# Patient Record
Sex: Female | Born: 1985 | Race: Black or African American | Hispanic: No | Marital: Single | Smoking: Never smoker
Health system: Southern US, Community
[De-identification: ages and names within clinical notes are randomized; demographics above are authoritative.]

## PROBLEM LIST (undated history)

## (undated) DIAGNOSIS — N6310 Unspecified lump in the right breast, unspecified quadrant: Secondary | ICD-10-CM

## (undated) HISTORY — PX: ACHILLES TENDON SURGERY: SHX542

## (undated) HISTORY — DX: Unspecified lump in the right breast, unspecified quadrant: N63.10

---
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
## 2004-08-25 HISTORY — PX: TUBAL LIGATION: SHX77

## 2014-05-11 ENCOUNTER — Emergency Department (HOSPITAL_COMMUNITY)
Admission: EM | Admit: 2014-05-11 | Discharge: 2014-05-11 | Disposition: A | Discharge status: Home / Self Care | Payer: Medicaid Other | Attending: Emergency Medicine | Admitting: Emergency Medicine

## 2014-05-11 ENCOUNTER — Encounter (HOSPITAL_COMMUNITY): Payer: Self-pay | Admitting: Emergency Medicine

## 2014-05-11 DIAGNOSIS — K047 Periapical abscess without sinus: Secondary | ICD-10-CM | POA: Insufficient documentation

## 2014-05-11 DIAGNOSIS — K029 Dental caries, unspecified: Secondary | ICD-10-CM | POA: Diagnosis not present

## 2014-05-11 DIAGNOSIS — K089 Disorder of teeth and supporting structures, unspecified: Secondary | ICD-10-CM | POA: Diagnosis present

## 2014-05-11 DIAGNOSIS — K0520 Aggressive periodontitis, unspecified: Secondary | ICD-10-CM

## 2014-05-11 MED ORDER — HYDROCODONE-ACETAMINOPHEN 5-325 MG PO TABS: 1.0000 | ORAL_TABLET | ORAL | Status: DC | PRN

## 2014-05-11 MED ORDER — AMOXICILLIN 500 MG PO CAPS: 500.0000 mg | ORAL_CAPSULE | Freq: Three times a day (TID) | ORAL | Status: DC

## 2014-05-11 NOTE — ED Notes (Signed)
Rt mandibular dental pain for 2 days

## 2014-05-11 NOTE — ED Provider Notes (Signed)
CSN: [id number]     Arrival date & time 05/11/14  1742 History   First MD Initiated Contact with Patient 05/11/14 1751     Chief Complaint  Patient presents with  . Dental Pain     (Consider location/radiation/quality/duration/timing/severity/associated sxs/prior Treatment) Patient is a 28 y.o. female presenting with tooth pain. The history is provided by the patient.  Dental Pain Location:  Lower Lower teeth location:  32/RL 3rd molar Quality:  Throbbing and constant Severity:  Severe Onset quality:  Gradual Duration:  3 days Timing:  Constant Progression:  Worsening Chronicity:  New Context: abscess and dental caries   Relieved by:  Nothing Worsened by:  Cold food/drink, pressure and jaw movement Ineffective treatments:  Acetaminophen Associated symptoms: facial swelling and gum swelling   Associated symptoms: no trismus    Joan Mills is a 28 y.o. female who presents to the ED with right lower wisdom tooth pain x 3 days. She has noted swelling of the gum and the side of her right jaw. She denies fever or other problems.  History reviewed. No pertinent past medical history. Past Surgical History  Procedure Laterality Date  . Achilles tendon surgery     History reviewed. No pertinent family history. History  Substance Use Topics  . Smoking status: Never Smoker   . Smokeless tobacco: Not on file  . Alcohol Use: Yes   OB History   Grav Para Term Preterm Abortions TAB SAB Ect Mult Living                 Review of Systems  HENT: Positive for dental problem and facial swelling.   all other systems negative    Allergies  Review of patient's allergies indicates no known allergies.  Home Medications   Prior to Admission medications   Medication Sig Start Date End Date Taking? Authorizing Provider  acetaminophen (TYLENOL) 500 MG tablet Take 500 mg by mouth every 6 (six) hours as needed for mild pain.   Yes Historical Provider, MD  Aspirin-Caffeine (BC FAST PAIN  RELIEF PO) Take 1 Package by mouth daily as needed (pain).   Yes Historical Provider, MD   BP 123/64  Pulse 65  Temp(Src) 99 F (37.2 C) (Oral)  Resp 16  SpO2 100%  LMP 04/13/2014 Physical Exam  Nursing note and vitals reviewed. Constitutional: She is oriented to person, place, and time. She appears well-developed and well-nourished. No distress.  HENT:  Mouth/Throat: Uvula is midline, oropharynx is clear and moist and mucous membranes are normal.    Right lower third molar with decay on the partially erupted tooth. There is swelling and erythema of the gum surrounding the tooth. The tooth is tender on exam.   Eyes: EOM are normal.  Neck: Neck supple.  Cardiovascular: Normal rate.   Pulmonary/Chest: Effort normal.  Musculoskeletal: Normal range of motion.  Neurological: She is alert and oriented to person, place, and time. No cranial nerve deficit.  Skin: Skin is warm and dry.  Psychiatric: She has a normal mood and affect. Her behavior is normal.    ED Course  Procedures (including critical care time) Labs Review  MDM  28 y.o. female with dental abscess and dental decay. Will treat for infection and pain. Information regarding dental clinics given. Discussed with the patient and all questioned fully answered.    Medication List    TAKE these medications       amoxicillin 500 MG capsule  Commonly known as:  AMOXIL  Take 1 capsule (  500 mg total) by mouth 3 (three) times daily.     HYDROcodone-acetaminophen 5-325 MG per tablet  Commonly known as:  NORCO/VICODIN  Take 1 tablet by mouth every 4 (four) hours as needed.      ASK your doctor about these medications       acetaminophen 500 MG tablet  Commonly known as:  TYLENOL  Take 500 mg by mouth every 6 (six) hours as needed for mild pain.     BC FAST PAIN RELIEF PO  Take 1 Package by mouth daily as needed (pain).            [facility] Orlene Och, [place] 05/11/14 [phone]

## 2014-05-11 NOTE — Discharge Instructions (Signed)
You can take ibuprofen in addition to the medications we give you. Do not take the narcotic if driving as it will make you sleepy.

## 2014-05-16 NOTE — ED Provider Notes (Signed)
Medical screening examination/treatment/procedure(s) were performed by non-physician practitioner and as supervising physician I was immediately available for consultation/collaboration.   EKG Interpretation None        Rin Gorton, MD 05/16/14 1538 

## 2014-10-10 ENCOUNTER — Other Ambulatory Visit (HOSPITAL_COMMUNITY): Payer: Self-pay | Admitting: *Deleted

## 2014-10-10 DIAGNOSIS — N63 Unspecified lump in unspecified breast: Secondary | ICD-10-CM

## 2014-10-17 ENCOUNTER — Ambulatory Visit (HOSPITAL_COMMUNITY): Payer: Medicaid Other

## 2014-10-17 ENCOUNTER — Other Ambulatory Visit (HOSPITAL_COMMUNITY): Payer: Self-pay | Admitting: *Deleted

## 2014-10-17 ENCOUNTER — Ambulatory Visit (HOSPITAL_COMMUNITY)
Admission: RE | Admit: 2014-10-17 | Discharge: 2014-10-17 | Disposition: A | Discharge status: Home / Self Care | Payer: Medicaid Other | Source: Ambulatory Visit | Attending: *Deleted | Admitting: *Deleted

## 2014-10-17 ENCOUNTER — Other Ambulatory Visit (HOSPITAL_COMMUNITY): Payer: Medicaid Other

## 2014-10-17 DIAGNOSIS — N63 Unspecified lump in unspecified breast: Secondary | ICD-10-CM

## 2014-10-17 DIAGNOSIS — N6310 Unspecified lump in the right breast, unspecified quadrant: Secondary | ICD-10-CM

## 2014-10-17 HISTORY — DX: Unspecified lump in the right breast, unspecified quadrant: N63.10

## 2014-12-27 ENCOUNTER — Encounter (HOSPITAL_COMMUNITY): Payer: Self-pay | Admitting: Emergency Medicine

## 2014-12-27 ENCOUNTER — Emergency Department (HOSPITAL_COMMUNITY)
Admission: EM | Admit: 2014-12-27 | Discharge: 2014-12-27 | Disposition: A | Discharge status: Home / Self Care | Payer: Medicaid Other | Attending: Emergency Medicine | Admitting: Emergency Medicine

## 2014-12-27 DIAGNOSIS — Z792 Long term (current) use of antibiotics: Secondary | ICD-10-CM | POA: Diagnosis not present

## 2014-12-27 DIAGNOSIS — Z79899 Other long term (current) drug therapy: Secondary | ICD-10-CM | POA: Insufficient documentation

## 2014-12-27 DIAGNOSIS — J020 Streptococcal pharyngitis: Secondary | ICD-10-CM | POA: Diagnosis not present

## 2014-12-27 DIAGNOSIS — J029 Acute pharyngitis, unspecified: Secondary | ICD-10-CM | POA: Diagnosis present

## 2014-12-27 LAB — RAPID STREP SCREEN (MED CTR MEBANE ONLY): Streptococcus, Group A Screen (Direct): POSITIVE — AB

## 2014-12-27 MED ORDER — PENICILLIN G BENZATHINE 1200000 UNIT/2ML IM SUSP
1.2000 10*6.[IU] | Freq: Once | INTRAMUSCULAR | Status: AC
  Administered 2014-12-27: 1.2 10*6.[IU] via INTRAMUSCULAR
  Filled 2014-12-27: qty 2

## 2014-12-27 NOTE — ED Notes (Signed)
Pt reports sore throat since this am. nad noted. Airway patent.

## 2014-12-27 NOTE — ED Provider Notes (Signed)
CSN: [id number]     Arrival date & time 12/27/14  0714 History   First MD Initiated Contact with Patient 12/27/14 0725     Chief Complaint  Patient presents with  . Sore Throat     (Consider location/radiation/quality/duration/timing/severity/associated sxs/prior Treatment) HPI Comments: Sore throat with body aches and cough since last night. Exposure to strep at home. No fever. No chest pain or shortness of breath. No difficulty breathing but pain with swallowing. No abdominal pain, nausea or vomiting. No urinary symptoms. No recent travel.  The history is provided by the patient.    History reviewed. No pertinent past medical history. Past Surgical History  Procedure Laterality Date  . Achilles tendon surgery    . Cesarean section     History reviewed. No pertinent family history. History  Substance Use Topics  . Smoking status: Never Smoker   . Smokeless tobacco: Not on file  . Alcohol Use: No   OB History    No data available     Review of Systems  Constitutional: Negative for fever, activity change and appetite change.  HENT: Positive for congestion and sore throat.   Eyes: Negative for visual disturbance.  Respiratory: Positive for cough. Negative for shortness of breath.   Cardiovascular: Negative for chest pain.  Gastrointestinal: Negative for nausea, vomiting and abdominal pain.  Genitourinary: Negative for dysuria and hematuria.  Musculoskeletal: Negative for myalgias and arthralgias.  Skin: Negative for rash.  Neurological: Negative for dizziness, weakness and headaches.  A complete 10 system review of systems was obtained and all systems are negative except as noted in the HPI and PMH.      Allergies  Review of patient's allergies indicates no known allergies.  Home Medications   Prior to Admission medications   Medication Sig Start Date End Date Taking? Authorizing Provider  acetaminophen (TYLENOL) 500 MG tablet Take 500 mg by mouth every 6 (six)  hours as needed for mild pain.    Historical Provider, MD  amoxicillin (AMOXIL) 500 MG capsule Take 1 capsule (500 mg total) by mouth 3 (three) times daily. 05/11/14   Hope Orlene OchM Neese, NP  Aspirin-Caffeine (BC FAST PAIN RELIEF PO) Take 1 Package by mouth daily as needed (pain).    Historical Provider, MD  HYDROcodone-acetaminophen (NORCO/VICODIN) 5-325 MG per tablet Take 1 tablet by mouth every 4 (four) hours as needed. 05/11/14   Hope Orlene OchM Neese, NP   BP 114/75 mmHg  Pulse 74  Temp(Src) 98.2 F (36.8 C) (Oral)  Resp 18  Ht 5\' 3"  (1.6 m)  Wt 156 lb (70.761 kg)  BMI 27.64 kg/m2  SpO2 100%  LMP 12/23/2014 Physical Exam  Constitutional: She is oriented to person, place, and time. She appears well-developed and well-nourished. No distress.  HENT:  Head: Normocephalic and atraumatic.  Mouth/Throat: Oropharyngeal exudate present.  erythematous oropharynx with exudates, no asymmetry, uvula midline  Eyes: Conjunctivae and EOM are normal. Pupils are equal, round, and reactive to light.  Neck: Normal range of motion. Neck supple.  No meningismus.  Cardiovascular: Normal rate, regular rhythm, normal heart sounds and intact distal pulses.   No murmur heard. Pulmonary/Chest: Effort normal and breath sounds normal. No respiratory distress. She has no wheezes.  Abdominal: Soft. There is no tenderness. There is no rebound and no guarding.  Musculoskeletal: Normal range of motion. She exhibits no edema or tenderness.  Neurological: She is alert and oriented to person, place, and time. No cranial nerve deficit. She exhibits normal muscle tone. Coordination normal.  No ataxia on finger to nose bilaterally. No pronator drift. 5/5 strength throughout. CN 2-12 intact. Negative Romberg. Equal grip strength. Sensation intact. Gait is normal.   Skin: Skin is warm.  Psychiatric: She has a normal mood and affect. Her behavior is normal.  Nursing note and vitals reviewed.   ED Course  Procedures (including critical  care time) Labs Review Labs Reviewed  RAPID STREP SCREEN - Abnormal; Notable for the following:    Streptococcus, Group A Screen (Direct) POSITIVE (*)    All other components within normal limits    Imaging Review No results found.   EKG Interpretation None      MDM   Final diagnoses:  Strep pharyngitis   Sore throat since this morning with exposure to strep at home. Nontoxic appearance, benign exam  Rapid strep positive.  Treated with bicillin.  Follow up with PCP. Return precautions discussed.   Glynn OctaveStephen Ortha Metts, MD 12/27/14 [phone]

## 2014-12-27 NOTE — Discharge Instructions (Signed)

## 2016-01-16 IMAGING — US US BREAST LTD UNI RIGHT INC AXILLA
1 series · 12 of 12 positions shown · non-contrast
Comparison: Previous exam(s).

CLINICAL DATA: 28-year-old female with a palpable abnormality in
the periareolar right breast. The patient states this was recently
felt by her clinician, however the patient states this is been
present for nearly 15 years. She states this mass has been stable in
size for several years.

EXAM:
ULTRASOUND OF THE RIGHT BREAST

[Series 1: us breast ltd uni right inc axilla · 0.06mm/px · 12 of 12 slices shown]
[im 1/12]
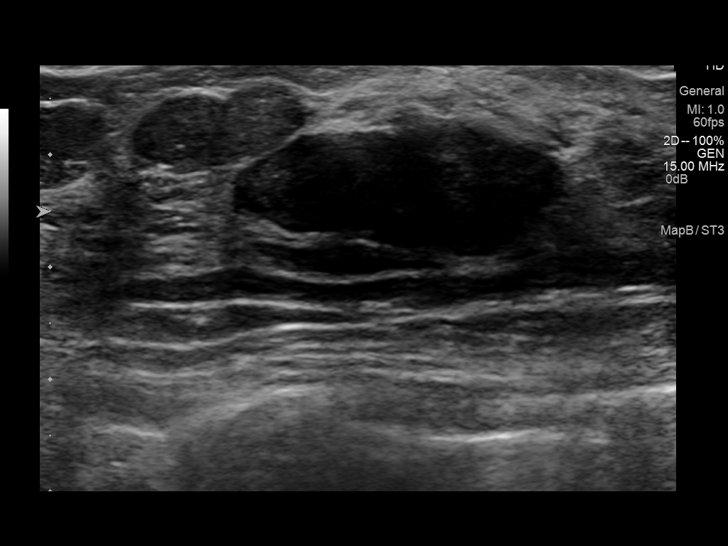
[im 2/12]
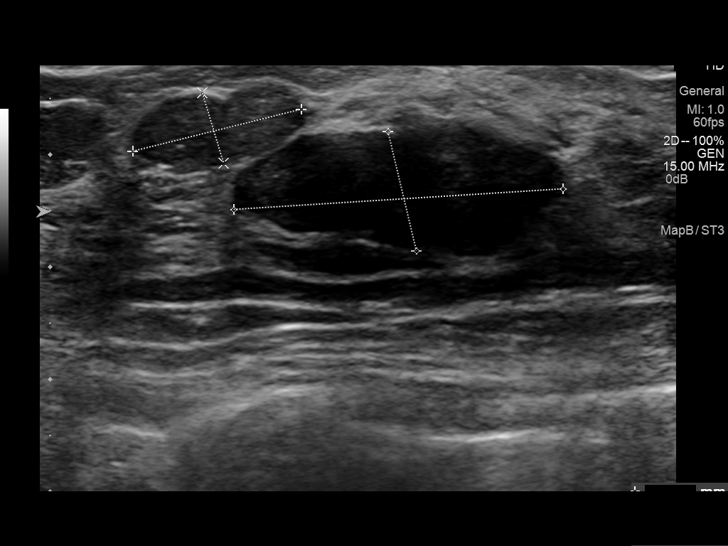
[im 3/12]
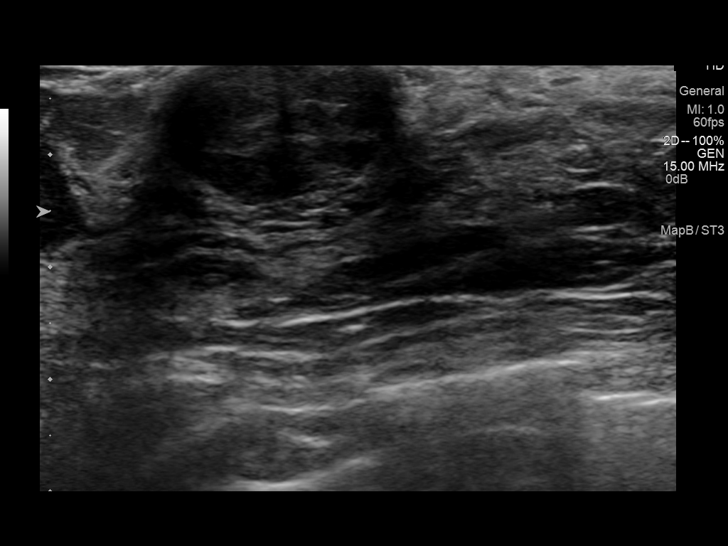
[im 4/12]
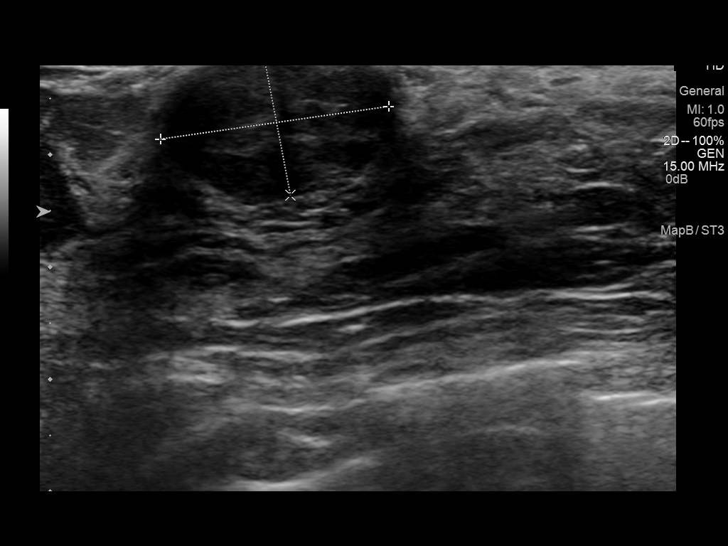
[im 5/12]
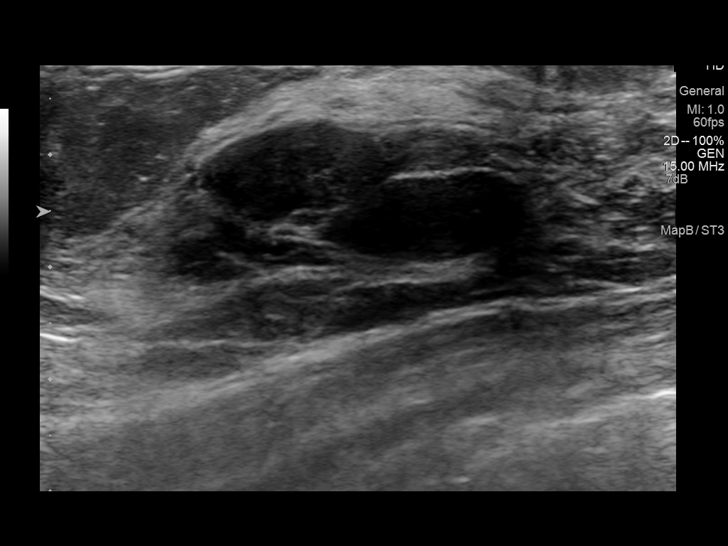
[im 6/12]
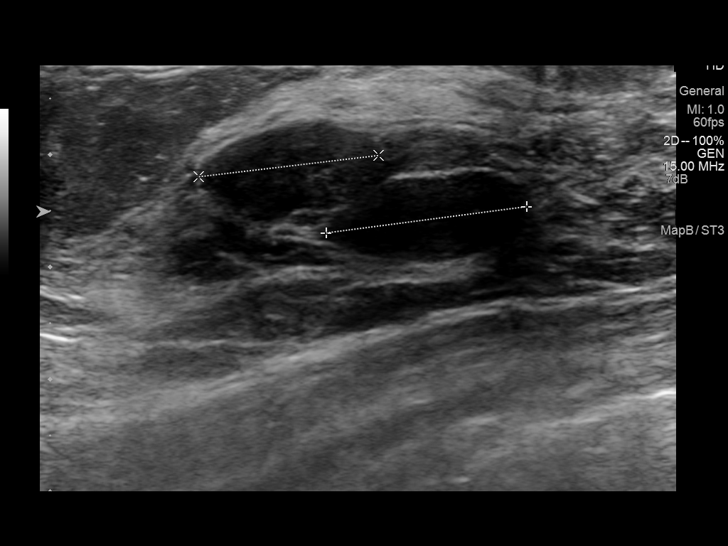
[im 7/12]
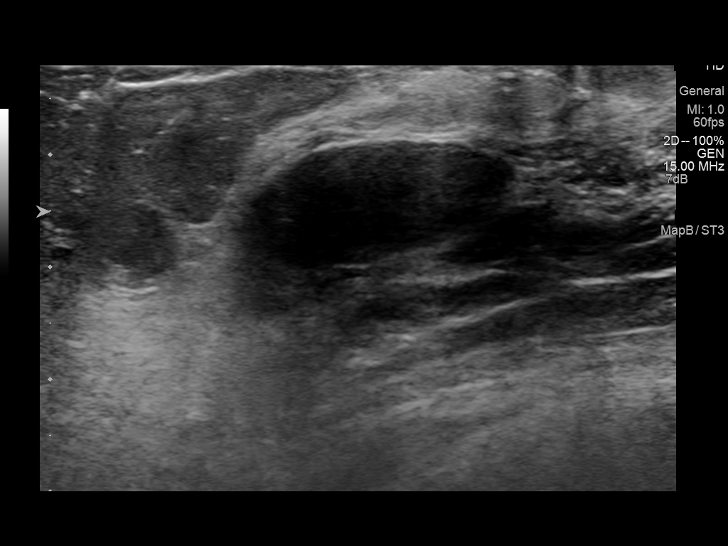
[im 8/12]
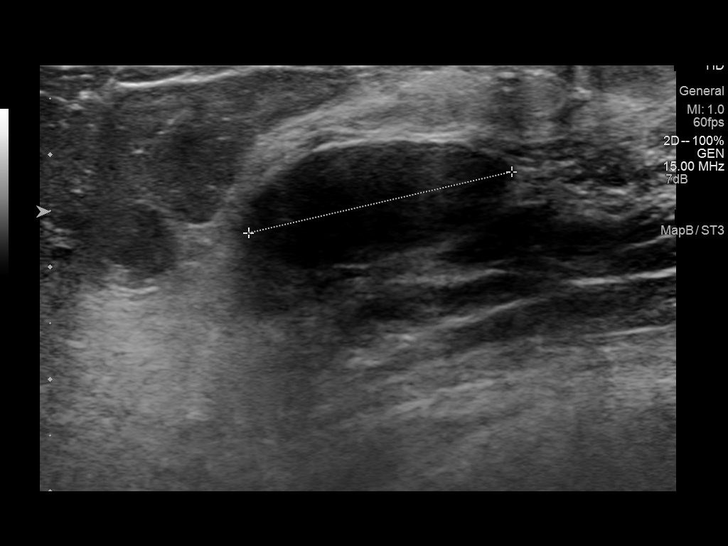
[im 9/12]
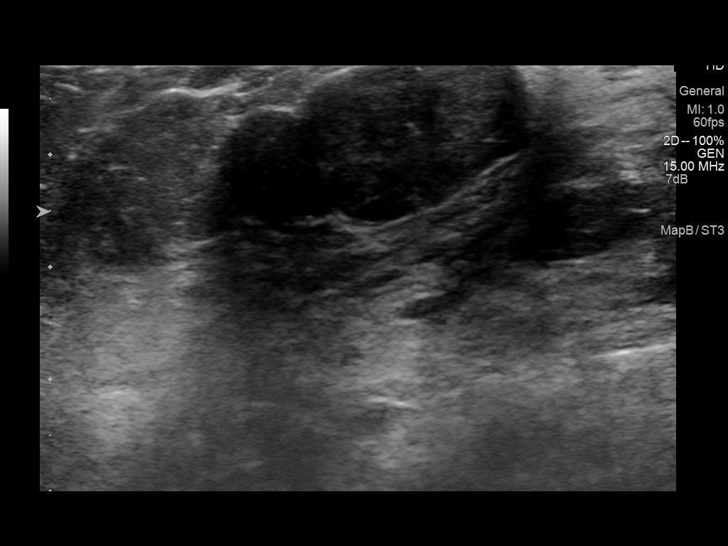
[im 10/12]
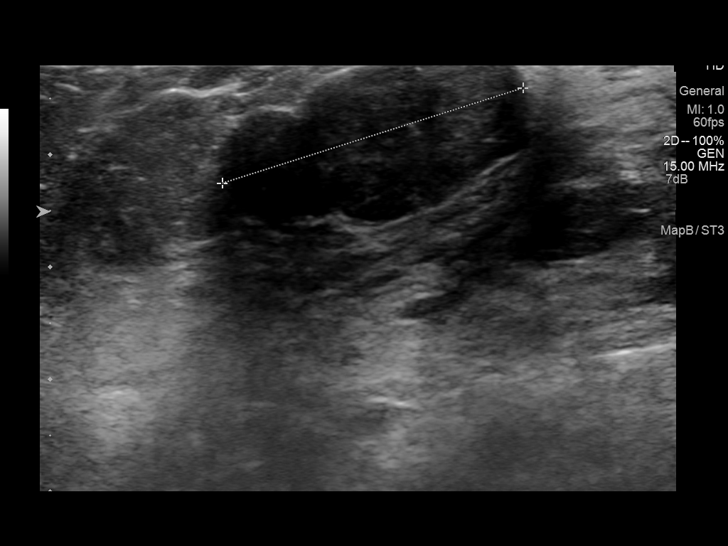
[im 11/12]
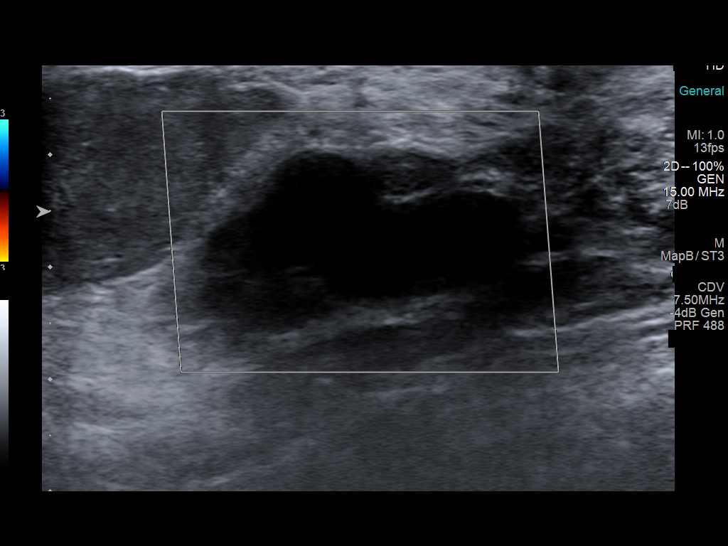
[im 12/12]
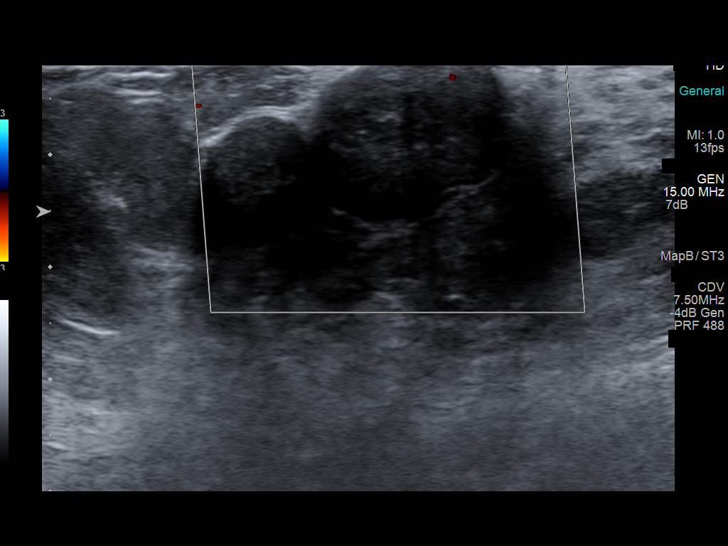

[12 of 12 positions shown; findings below may reference images not displayed]

FINDINGS: Physical examination at site of palpable concern in the periareolar
right breast at 11 o'clock reveals a highly mobile nodule.

Targeted ultrasound of the right breast was performed demonstrating
a cluster of oval and gently lobulated well-circumscribed masses at
11 o'clock periareolar, with the larger of these masses measuring
2.9 x 1.1 by 2.4 cm. An additional superficial gently lobulated mass
measures 2.1 x 1.2 x 2.8 cm (possibly 2 adjacent masses).
IMPRESSION: Probably benign right breast masses.

RECOMMENDATION:
Right breast ultrasound in 6 months to demonstrate stability of the
probably benign right breast masses.

I have discussed the findings and recommendations with the patient.
Results were also provided in writing at the conclusion of the
visit. If applicable, a reminder letter will be sent to the patient
regarding the next appointment.

BI-RADS CATEGORY  3: Probably benign.

## 2017-01-03 ENCOUNTER — Emergency Department (HOSPITAL_COMMUNITY): Payer: Medicaid Other

## 2017-01-03 ENCOUNTER — Emergency Department (HOSPITAL_COMMUNITY)
Admission: EM | Admit: 2017-01-03 | Discharge: 2017-01-03 | Disposition: A | Discharge status: Home / Self Care | Payer: Medicaid Other | Attending: Emergency Medicine | Admitting: Emergency Medicine

## 2017-01-03 DIAGNOSIS — M25531 Pain in right wrist: Secondary | ICD-10-CM | POA: Insufficient documentation

## 2017-01-03 DIAGNOSIS — S60511A Abrasion of right hand, initial encounter: Secondary | ICD-10-CM | POA: Insufficient documentation

## 2017-01-03 DIAGNOSIS — Y939 Activity, unspecified: Secondary | ICD-10-CM | POA: Insufficient documentation

## 2017-01-03 DIAGNOSIS — S80812A Abrasion, left lower leg, initial encounter: Secondary | ICD-10-CM | POA: Insufficient documentation

## 2017-01-03 DIAGNOSIS — Y999 Unspecified external cause status: Secondary | ICD-10-CM | POA: Insufficient documentation

## 2017-01-03 DIAGNOSIS — Z7982 Long term (current) use of aspirin: Secondary | ICD-10-CM | POA: Insufficient documentation

## 2017-01-03 DIAGNOSIS — Y92410 Unspecified street and highway as the place of occurrence of the external cause: Secondary | ICD-10-CM | POA: Insufficient documentation

## 2017-01-03 DIAGNOSIS — S60512A Abrasion of left hand, initial encounter: Secondary | ICD-10-CM | POA: Insufficient documentation

## 2017-01-03 DIAGNOSIS — S7012XA Contusion of left thigh, initial encounter: Secondary | ICD-10-CM | POA: Insufficient documentation
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
## 2017-01-03 NOTE — ED Triage Notes (Signed)
Wounds to hands and arm cleaned with NS.
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
## 2017-01-03 NOTE — ED Notes (Signed)
Declined W/C at D/C and was escorted to lobby by RN. 

## 2017-01-03 NOTE — Discharge Instructions (Signed)
Wear a wrist splint as much as possible. Take ibuprofen scheduled for the next 3 days for pain, inflammation and prevention. Return to ED for worsening pain, additional injury, trouble walking, vision changes, headache.
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
## 2018-09-30 ENCOUNTER — Ambulatory Visit: Payer: Self-pay | Admitting: Family Medicine

## 2018-10-07 ENCOUNTER — Encounter (HOSPITAL_COMMUNITY): Payer: Self-pay

## 2018-10-07 ENCOUNTER — Emergency Department (HOSPITAL_COMMUNITY)
Admission: EM | Admit: 2018-10-07 | Discharge: 2018-10-07 | Disposition: A | Discharge status: Home / Self Care | Payer: 59 | Attending: Emergency Medicine | Admitting: Emergency Medicine

## 2018-10-07 ENCOUNTER — Other Ambulatory Visit: Payer: Self-pay

## 2018-10-07 ENCOUNTER — Emergency Department (HOSPITAL_COMMUNITY): Payer: 59

## 2018-10-07 DIAGNOSIS — Z79899 Other long term (current) drug therapy: Secondary | ICD-10-CM | POA: Diagnosis not present

## 2018-10-07 DIAGNOSIS — N12 Tubulo-interstitial nephritis, not specified as acute or chronic: Secondary | ICD-10-CM

## 2018-10-07 DIAGNOSIS — R300 Dysuria: Secondary | ICD-10-CM | POA: Diagnosis present

## 2018-10-07 LAB — URINALYSIS, ROUTINE W REFLEX MICROSCOPIC
BILIRUBIN URINE: NEGATIVE
Glucose, UA: NEGATIVE mg/dL
KETONES UR: NEGATIVE mg/dL
Nitrite: POSITIVE — AB
Protein, ur: 100 mg/dL — AB
RBC / HPF: >50 RBC/hpf — ABNORMAL HIGH (ref 0–5)
Specific Gravity, Urine: 1.015 (ref 1.005–1.030)
WBC, UA: >50 WBC/hpf — ABNORMAL HIGH (ref 0–5)
pH: 6 (ref 5.0–8.0)

## 2018-10-07 LAB — CBC
HEMATOCRIT: 43.3 % (ref 36.0–46.0)
HEMOGLOBIN: 14 g/dL (ref 12.0–15.0)
MCH: 31.1 pg (ref 26.0–34.0)
MCHC: 32.3 g/dL (ref 30.0–36.0)
MCV: 96.2 fL (ref 80.0–100.0)
Platelets: 347 10*3/uL (ref 150–400)
RBC: 4.5 MIL/uL (ref 3.87–5.11)
RDW: 11.5 % (ref 11.5–15.5)
WBC: 10.2 10*3/uL (ref 4.0–10.5)
nRBC: 0 % (ref 0.0–0.2)

## 2018-10-07 LAB — BASIC METABOLIC PANEL
Anion gap: 13 (ref 5–15)
BUN: 7 mg/dL (ref 6–20)
CHLORIDE: 102 mmol/L (ref 98–111)
CO2: 24 mmol/L (ref 22–32)
Calcium: 9.2 mg/dL (ref 8.9–10.3)
Creatinine, Ser: 1.01 mg/dL — ABNORMAL HIGH (ref 0.44–1.00)
GFR calc Af Amer: >60 mL/min (ref >60)
GFR calc non Af Amer: >60 mL/min (ref >60)
Glucose, Bld: 106 mg/dL — ABNORMAL HIGH (ref 70–99)
POTASSIUM: 4.1 mmol/L (ref 3.5–5.1)
Sodium: 139 mmol/L (ref 135–145)

## 2018-10-07 LAB — I-STAT BETA HCG BLOOD, ED (MC, WL, AP ONLY): I-stat hCG, quantitative: <5 m[IU]/mL (ref <5)

## 2018-10-07 MED ORDER — KETOROLAC TROMETHAMINE 30 MG/ML IJ SOLN: 30.0000 mg | Freq: Once | INTRAMUSCULAR | Status: DC

## 2018-10-07 MED ORDER — NAPROXEN 500 MG PO TABS: 500.0000 mg | ORAL_TABLET | Freq: Two times a day (BID) | ORAL | 0 refills | Status: AC

## 2018-10-07 MED ORDER — SULFAMETHOXAZOLE-TRIMETHOPRIM 800-160 MG PO TABS: 1.0000 | ORAL_TABLET | Freq: Two times a day (BID) | ORAL | 0 refills | Status: AC

## 2018-10-07 MED ORDER — KETOROLAC TROMETHAMINE 30 MG/ML IJ SOLN: 60.0000 mg | Freq: Once | INTRAMUSCULAR | Status: DC

## 2018-10-07 MED ORDER — KETOROLAC TROMETHAMINE 30 MG/ML IJ SOLN
30.0000 mg | Freq: Once | INTRAMUSCULAR | Status: AC
  Administered 2018-10-07: 30 mg via INTRAVENOUS
  Filled 2018-10-07: qty 1

## 2018-10-07 MED ORDER — SODIUM CHLORIDE 0.9 % IV SOLN
1.0000 g | Freq: Once | INTRAVENOUS | Status: AC
  Administered 2018-10-07: 1 g via INTRAVENOUS
  Filled 2018-10-07: qty 10

## 2018-10-07 NOTE — ED Provider Notes (Addendum)
[facility] EMERGENCY DEPARTMENT Provider Note   CSN: [id number] Arrival date & time: 10/07/18  0813  History   Chief Complaint Chief Complaint  Patient presents with  . Hematuria  . Urinary Tract Infection    HPI Joan Mills is a 33 y.o. female presenting with dysuria and urinary frequency onset 3 days ago. Patient reports intermittent sharp right sided flank pain radiating to the right back onset this morning. Patient states she was evaluated at [facility] and advised to come to the ER for evaluation of a possible kidney stone. Patient denies a history of kidney stones. Patient reports cousin has a history of kidney stones. Patient denies nausea, vomiting, or abdominal pain. Patient denies fever, chills, cough, congestion, or recent illness. Patient states she is sexually active in a monogamous relationship and denies vaginal discharge. LMP on 01/26. Denies numbness, tingling, weakness, incontinence to bowel/bladder, fever, chills, IV drug use, or hx of cancer. Patient denies sick contacts.  HPI  History reviewed. No pertinent past medical history.  There are no active problems to display for this patient.   Past Surgical History:  Procedure Laterality Date  . ACHILLES TENDON SURGERY    . CESAREAN SECTION       OB History   No obstetric history on file.      Home Medications    Prior to Admission medications   Medication Sig Start Date End Date Taking? Authorizing Provider  acetaminophen (TYLENOL) 500 MG tablet Take 500 mg by mouth every 6 (six) hours as needed for mild pain.    [provider]  amoxicillin (AMOXIL) 500 MG capsule Take 1 capsule (500 mg total) by mouth 3 (three) times daily. 05/11/14   Janne Napoleon, NP  Aspirin-Caffeine (BC FAST PAIN RELIEF PO) Take 1 Package by mouth daily as needed (pain).    [provider]  HYDROcodone-acetaminophen (NORCO/VICODIN) 5-325 MG per tablet Take 1 tablet by mouth every 4 (four) hours as  needed. 05/11/14   Janne Napoleon, NP  naproxen (NAPROSYN) 500 MG tablet Take 1 tablet (500 mg total) by mouth 2 (two) times daily. 10/07/18   Carlyle Basques P, PA-C  sulfamethoxazole-trimethoprim (BACTRIM DS,SEPTRA DS) 800-160 MG tablet Take 1 tablet by mouth 2 (two) times daily for 10 days. 10/07/18 10/17/18  Leretha Dykes, PA-C    Family History History reviewed. No pertinent family history.  Social History Social History   Tobacco Use  . Smoking status: Never Smoker  Substance Use Topics  . Alcohol use: No  . Drug use: No     Allergies   Patient has no known allergies.   Review of Systems Review of Systems  Constitutional: Negative for activity change, appetite change, chills and fever.  HENT: Negative for congestion and rhinorrhea.   Respiratory: Negative for shortness of breath.   Gastrointestinal: Negative for abdominal pain, constipation, diarrhea, nausea and vomiting.  Endocrine: Negative for cold intolerance and heat intolerance.  Genitourinary: Positive for dysuria, flank pain, frequency and hematuria. Negative for difficulty urinating, genital sores and vaginal discharge.  Musculoskeletal: Positive for back pain. Negative for gait problem.  Skin: Negative for rash and wound.  Allergic/Immunologic: Negative for immunocompromised state.  Neurological: Negative for dizziness and weakness.     Physical Exam Updated Vital Signs BP 122/83 (BP Location: Right Arm)   Pulse 72   Temp 98.2 F (36.8 C) (Oral)   Resp 16   LMP 09/19/2018 (Exact Date)   SpO2 100%   Physical Exam Vitals  signs and nursing note reviewed.  Constitutional:      General: She is not in acute distress.    Appearance: She is well-developed. She is not diaphoretic.  HENT:     Head: Normocephalic and atraumatic.  Neck:     Musculoskeletal: Normal range of motion and neck supple.  Cardiovascular:     Rate and Rhythm: Normal rate and regular rhythm.     Heart sounds: Normal heart sounds. No  murmur. No friction rub. No gallop.   Pulmonary:     Effort: Pulmonary effort is normal. No respiratory distress.     Breath sounds: Normal breath sounds. No wheezing or rales.  Abdominal:     General: Bowel sounds are normal.     Palpations: Abdomen is soft. Abdomen is not rigid. There is no mass.     Tenderness: There is no abdominal tenderness. There is right CVA tenderness. There is no left CVA tenderness, guarding or rebound.  Musculoskeletal:     Cervical back: Normal. She exhibits normal range of motion, no tenderness and no bony tenderness.     Thoracic back: Normal. She exhibits normal range of motion, no tenderness and no bony tenderness.     Lumbar back: She exhibits decreased range of motion and tenderness. She exhibits no bony tenderness.     Comments: No skin changes noted. Mild right sided flank pain and right sided back pain on palpation noted on exam. Decreased ROM of lumbar spine due to pain. No midline tenderness in cervical, thoracic, or lumbar spine. Sensation intact. 5/5 strength in lower extremities. 2+DP pulses.  Skin:    Findings: No erythema or rash.  Neurological:     Mental Status: She is alert and oriented to person, place, and time.    ED Treatments / Results  Labs (all labs ordered are listed, but only abnormal results are displayed) Labs Reviewed  URINALYSIS, ROUTINE W REFLEX MICROSCOPIC - Abnormal; Notable for the following components:      Result Value   Color, Urine AMBER (*)    APPearance CLOUDY (*)    Hgb urine dipstick LARGE (*)    Protein, ur 100 (*)    Nitrite POSITIVE (*)    Leukocytes,Ua LARGE (*)    RBC / HPF >50 (*)    WBC, UA >50 (*)    Bacteria, UA RARE (*)    All other components within normal limits  BASIC METABOLIC PANEL - Abnormal; Notable for the following components:   Glucose, Bld 106 (*)    Creatinine, Ser 1.01 (*)    All other components within normal limits  URINE CULTURE  CBC  I-STAT BETA HCG BLOOD, ED ([facility], [facility], [facility] ONLY)     EKG None  Radiology Ct Renal Stone Study  Result Date: 10/07/2018 CLINICAL DATA:  Right flank pain, gross hematuria EXAM: CT ABDOMEN AND PELVIS WITHOUT CONTRAST TECHNIQUE: Multidetector CT imaging of the abdomen and pelvis was performed following the standard protocol without IV contrast. COMPARISON:  None. FINDINGS: Lower chest: No acute abnormality. Hepatobiliary: No focal liver abnormality is seen. No gallstones, gallbladder wall thickening, or biliary dilatation. Pancreas: Unremarkable. No pancreatic ductal dilatation or surrounding inflammatory changes. Spleen: Normal in size without focal abnormality. Adrenals/Urinary Tract: Adrenal glands are unremarkable. Tiny nonobstructive inferior pole calculus of the left kidney (series 3, image 25). Thickening and mild fat stranding about the urinary bladder. Stomach/Bowel: Stomach is within normal limits. Appendix appears normal. No evidence of bowel wall thickening, distention, or inflammatory changes. Vascular/Lymphatic: No  significant vascular findings are present. No enlarged abdominal or pelvic lymph nodes. Reproductive: No mass or other abnormality. Other: No abdominal wall hernia or abnormality. No abdominopelvic ascites. Musculoskeletal: No acute or significant osseous findings. IMPRESSION: 1. Tiny nonobstructive left renal calculus. No evidence of right-sided urinary tract calculus or hydronephrosis. 2. Thickening and mild fat stranding about the urinary bladder. Correlate with urinalysis for evidence of cystitis. Electronically Signed   By: Lauralyn PrimesAlex  Bibbey M.D.   On: 10/07/2018 11:02    Procedures Procedures (including critical care time)  Medications Ordered in ED Medications  cefTRIAXone (ROCEPHIN) 1 g in sodium chloride 0.9 % 100 mL IVPB (0 g Intravenous Stopped 10/07/18 1234)  ketorolac (TORADOL) 30 MG/ML injection 30 mg (30 mg Intravenous Given 10/07/18 1125)    Initial Impression / Assessment and Plan / ED Course  I have reviewed the  triage vital signs and the nursing notes.  Pertinent labs & imaging results that were available during my care of the patient were reviewed by me and considered in my medical decision making (see chart for details).  Clinical Course as of Oct 07 1238  Thu Oct 07, 2018  0930 UA reveals leukocytes, RBCs, WBCs, and nitrites consistent with a UTI. Will order urine culture.  Nitrite(!): POSITIVE [AH]  0930 WBCs are within normal limits.  WBC: 10.2 [AH]  1108 Tiny nonobstructive left renal calculus. No evidence of right-sided urinary tract calculus or hydronephrosis. Thickening and mild fat stranding about the urinary bladder consistent with cystitis.    CT Renal Soundra PilonStone Study [AH]    Clinical Course User Index [AH] Leretha DykesHernandez, Taliah Porche P, PA-C   Suspect symptoms are likely due to pyelonephritis. Patient has right sided CVA tenderness and UA reveals leukocytes, RBCs, nitrites, and WBCs. Urine culture ordered. Pt is afebrile, normotensive, and denies N/V. WBCs are within normal limits. CT stone study reveals a small non obstructive left renal calculus. Do not suspect this is causing symptoms. Provided ceftriaxone while in the ER. Patient states symptoms have improved. Pt to be dc home with antibiotics and instructions to follow up with PCP if symptoms persist. Discussed return precautions with patient.   Final Clinical Impressions(s) / ED Diagnoses   Final diagnoses:  Pyelonephritis    ED Discharge Orders         Ordered    sulfamethoxazole-trimethoprim (BACTRIM DS,SEPTRA DS) 800-160 MG tablet  2 times daily     10/07/18 1220    naproxen (NAPROSYN) 500 MG tablet  2 times daily     10/07/18 1239           Leretha DykesHernandez, Eriel Doyon P, [place]PA-C 10/07/18 1224    Carlyle BasquesHernandez, Evie Crumpler One[place]P, [place]PA-C 10/07/18 1241    Benjiman CorePickering, Nathan, MD 10/07/18 [phone]

## 2018-10-07 NOTE — Discharge Instructions (Addendum)
You have been seen today for urinary symptoms and flank pain. Please read and follow all provided instructions.   1. Medications: bactrim (antibiotic), naproxen for pain, usual home medications 2. Treatment: rest, drink plenty of fluids, tylenol for pain 3. Follow Up: Please follow up with your primary doctor in 2 days for discussion of your diagnoses and further evaluation after today's visit; if you do not have a primary care doctor use the resource guide provided to find one; Please return to the ER for any new or worsening symptoms. Please obtain all of your results from medical records or have your doctors office obtain the results - share them with your doctor - you should be seen at your doctors office. Call today to arrange your follow up.   Take medications as prescribed. Please review all of the medicines and only take them if you do not have an allergy to them. Return to the emergency room for worsening condition or new concerning symptoms. Follow up with your regular doctor. If you don't have a regular doctor use one of the numbers below to establish a primary care doctor.  Please be aware that if you are taking birth control pills, taking other prescriptions, ESPECIALLY ANTIBIOTICS may make the birth control ineffective - if this is the case, either do not engage in sexual activity or use alternative methods of birth control such as condoms until you have finished the medicine and your family doctor says it is OK to restart them. If you are on a blood thinner such as COUMADIN, be aware that any other medicine that you take may cause the coumadin to either work too much, or not enough - you should have your coumadin level rechecked in next 7 days if this is the case.  ?  It is also a possibility that you have an allergic reaction to any of the medicines that you have been prescribed - Everybody reacts differently to medications and while MOST people have no trouble with most medicines, you may  have a reaction such as nausea, vomiting, rash, swelling, shortness of breath. If this is the case, please stop taking the medicine immediately and contact your physician.  ?  You should return to the ER if you develop severe or worsening symptoms.   Emergency Department Resource Guide 1) Find a Doctor and Pay Out of Pocket Although you won't have to find out who is covered by your insurance plan, it is a good idea to ask around and get recommendations. You will then need to call the office and see if the doctor you have chosen will accept you as a new patient and what types of options they offer for patients who are self-pay. Some doctors offer discounts or will set up payment plans for their patients who do not have insurance, but you will need to ask so you aren't surprised when you get to your appointment.  2) Contact Your Local Health Department Not all health departments have doctors that can see patients for sick visits, but many do, so it is worth a call to see if yours does. If you don't know where your local health department is, you can check in your phone book. The CDC also has a tool to help you locate your state's health department, and many state websites also have listings of all of their local health departments.  3) Find a Walk-in Clinic If your illness is not likely to be very severe or complicated, you may want to  try a walk in clinic. These are popping up all over the country in pharmacies, drugstores, and shopping centers. They're usually staffed by nurse practitioners or physician assistants that have been trained to treat common illnesses and complaints. They're usually fairly quick and inexpensive. However, if you have serious medical issues or chronic medical problems, these are probably not your best option.  No Primary Care Doctor: Call Health Connect at  [phone] - they can help you locate a primary care doctor that  accepts your insurance, provides certain services,  etc. Physician Referral [phone]  Emergency Department Resource Guide 1) Find a Doctor and Pay Out of Pocket Although you won't have to find out who is covered by your insurance plan, it is a good idea to ask around and get recommendations. You will then need to call the office and see if the doctor you have chosen will accept you as a new patient and what types of options they offer for patients who are self-pay. Some doctors offer discounts or will set up payment plans for their patients who do not have insurance, but you will need to ask so you aren't surprised when you get to your appointment.  2) Contact Your Local Health Department Not all health departments have doctors that can see patients for sick visits, but many do, so it is worth a call to see if yours does. If you don't know where your local health department is, you can check in your phone book. The CDC also has a tool to help you locate your state's health department, and many state websites also have listings of all of their local health departments.  3) Find a [facility] If your illness is not likely to be very severe or complicated, you may want to try a walk in clinic. These are popping up all over the country in pharmacies, drugstores, and shopping centers. They're usually staffed by nurse practitioners or physician assistants that have been trained to treat common illnesses and complaints. They're usually fairly quick and inexpensive. However, if you have serious medical issues or chronic medical problems, these are probably not your best option.  No Primary Care Doctor: Call Health Connect at  [phone] - they can help you locate a primary care doctor that  accepts your insurance, provides certain services, etc. Physician Referral Service- [phone]  Chronic Pain Problems: Organization         Address  Phone   Notes  [facility]  [phone] Patients need to be referred by their  primary care doctor.   Medication Assistance: Organization         Address  Phone   Notes  [facility] Medication [facility] [address], [place] [address] [phone] --Must be a resident of [facility] -- Must have NO insurance coverage whatsoever (no Medicaid/ Medicare, etc.) -- The pt. MUST have a primary care doctor that directs their care regularly and follows them in the community   [facility]  [phone]   [employer]  [phone]    Agencies that provide inexpensive medical care: Organization         Address  Phone   Notes  [place]  [phone]   [facility]    [phone]   [facility] [place], [place] [address] [phone]   [place] [address] [place] [phone]  Planned Parenthood    [phone]   [facility]    [phone]   [facility] [address], [place] Phone:  [phone], Fax:  [phone] Hours of Operation:  9 am - 6 pm, M-F.  Also accepts Medicaid/Medicare and self-pay.  [facility] for [place], [address], [place] Phone: [phone], Fax: [phone]. Hours of Operation:  8:30 am - 5:30 pm, M-F.  Also accepts Medicaid and self-pay.  [facility] [place] [address], [place] Phone: [phone]   [place], [place], [place] [phone], Ext. [phone] Mondays & Thursdays: 7-9 AM.  First 15 patients are seen on a first come, first serve basis.    [place] Providers:  Organization         Address  Phone   Notes  [facility] [address],  [phone] Also accepts self-pay patients.  [facility] [address], [place]  [phone]   [place], [address], [place] [phone]   [facility] [address] [place] [phone]   Lucianne Lei [address], [place]   [phone] Only accepts [place] patients after they have their name applied to their card.   Self-Pay (no insurance) in [facility]:  Organization         Address  Phone   Notes  Sickle Cell Patients, [facility] [place] [phone]   [facility] [place] [phone]   [facility] [place]  [place], [place], [place] [phone]   [facility]/Dr. Osei-Bonsu  [address] [place] or [address], [place] [phone] Phone number for both [place] and [place] locations is the same.  Urgent Medical and [facility] [address], [place] [phone]   [facility] [address] [place] or [address] [phone]   [facility] [address], [place] [phone], phone; [phone], fax Sees patients 1st and 3rd Saturday of every month.  Must not qualify for public or private insurance (i.e. Medicaid, Medicare, [place] [employer] Choice, Veterans' Benefits)  Household income should be no more than 200% of the poverty level The clinic cannot treat you if you are pregnant or think you are pregnant  Sexually transmitted diseases are not treated at the clinic.

## 2018-10-07 NOTE — ED Triage Notes (Signed)
Pt endorses hematuria x 3 days, right sided flank pain that began this morning. Sent from [facility] for possible kidney stone, pt was diagnosed with UTI at [facility].

## 2018-10-07 NOTE — ED Notes (Signed)
Patient verbalizes understanding of discharge instructions. Opportunity for questioning and answers were provided. Armband removed by staff, pt discharged from ED.  

## 2018-10-09 LAB — URINE CULTURE: Culture: >=100000 — AB

## 2018-10-10 ENCOUNTER — Telehealth: Payer: Self-pay | Admitting: Emergency Medicine

## 2018-10-10 NOTE — Telephone Encounter (Signed)
Post ED Visit - Positive Culture Follow-up  Culture report reviewed by antimicrobial stewardship pharmacist:  []  Enzo Bi, Pharm.D. []  Celedonio Miyamoto, Pharm.D., BCPS AQ-ID []  Garvin Fila, Pharm.D., BCPS []  Georgina Pillion, Pharm.D., BCPS []  [place], [address]D., BCPS, AAHIVP []  Estella Husk, Pharm.D., BCPS, AAHIVP []  Lysle Pearl, PharmD, BCPS []  Phillips Climes, PharmD, BCPS []  Agapito Games, PharmD, BCPS []  Verlan Friends, PharmD Link Snuffer PharmD  Positive urine culture Treated with sulfamethoxazole-trimethoprim, organism sensitive to the same and no further patient follow-up is required at this time.  Berle Mull 10/10/2018, 11:49 AM

## 2018-10-28 ENCOUNTER — Ambulatory Visit (INDEPENDENT_AMBULATORY_CARE_PROVIDER_SITE_OTHER): Payer: No Typology Code available for payment source | Admitting: Family Medicine

## 2018-10-28 ENCOUNTER — Other Ambulatory Visit: Payer: Self-pay

## 2018-10-28 ENCOUNTER — Encounter: Payer: Self-pay | Admitting: Family Medicine

## 2018-10-28 VITALS — BP 120/78 | HR 76 | Temp 98.5°F | Ht 64.5 in | Wt 170.6 lb

## 2018-10-28 DIAGNOSIS — Z0000 Encounter for general adult medical examination without abnormal findings: Secondary | ICD-10-CM | POA: Diagnosis not present

## 2018-10-28 DIAGNOSIS — Z7689 Persons encountering health services in other specified circumstances: Secondary | ICD-10-CM | POA: Diagnosis not present

## 2018-10-28 NOTE — Patient Instructions (Addendum)
Dear Joetta Manners,   It was very nice to meet you! Thank you for taking your time to come in to be seen. Today, we discussed the following:   Annual Physical Exam/Establish Care  Recommended Labs   I will communicate any abnormal labs with you when they result.   We will see you in a few weeks for a Gyn follow up.   Be well,   Dr. Genia Hotter [facility] [phone]

## 2018-10-28 NOTE — Assessment & Plan Note (Addendum)
Patient doing well today.  Family history of diabetes with mildly elevated glucose at 106 during ED visit, will obtain A1c.  Blood pressure within normal limits and appears to be stable over previous visits.  BMI is 28.3.  Lipid panel, A1c, HIV screening  Schedule patient for Pap smear in the coming month  We will obtain medical records for Health Maintenance items  from health [facility] health department, [facility] clinic for prior OB/[facility] records and vaccine records (patient also wants to know her blood type, which should be in her previous OB records)

## 2018-10-28 NOTE — Progress Notes (Signed)
Subjective:  PCP: Joan Plan, MD Patient ID: MRN [id number]  Date of birth: 06/26/1986  CC: Establish Care   HPI Patient presents today to establish care. She does not have any complaints today. She is accompanied by her daughter, [name].   Review of Systems  Constitutional: Negative.   HENT: Negative.   Eyes: Negative.   Respiratory: Negative.   Cardiovascular: Negative.   Gastrointestinal: Negative.   Genitourinary: Negative.   Musculoskeletal: Negative.   Skin: Negative.   Neurological: Negative.   Endo/Heme/Allergies: Negative.   Psychiatric/Behavioral: Negative.    HISTORY Reviewed with patient and updated in EMR as appropriate.  Allergies and Medications No Known Allergies  No outpatient medications have been marked as taking for the 10/28/18 encounter (Office Visit) with Joan Plan, MD.   Past Medical History Past Medical History:  Diagnosis Date  . Breast mass, right 10/17/2014   Present since patient was age 72. Continues to be stable. U/S on 10/17/2014   Past Surgical History Past Surgical History:  Procedure Laterality Date  . ACHILLES TENDON SURGERY    . CESAREAN SECTION    . TUBAL LIGATION Bilateral 2006   Menstrual History:  See flowsheets  OB History:  OB History    Gravida  6   Para  3   Term  0   Preterm  3   AB  3   Living  3     SAB  1   TAB  2   Ectopic      Multiple  1   Live Births  3        Obstetric Comments  2006: C-section secondary to placenta previa 2007: C-section, per OB + Tubal ligation  2 [place] 1- [place], [place]          Family History family history includes Diabetes in her maternal grandfather and maternal grandmother; Hypertension in her father and mother.   Social History Social History   Social History Narrative   Lives with her partner - [name]   4 year old daughter "Cha-Nya" [place]    49 year old son "[name]"    28 year old son "[name]"   Occasionally in contact with  Dad    She owns a car.    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   For fun, she reads and hangs out with her children.    Liam reports that she has never smoked. She has never used smokeless tobacco. She reports that she does not drink alcohol or use drugs. Objective:  Physical Exam:  BP 120/78   Pulse 76   Temp 98.5 F (36.9 C) (Oral)   Ht 5' 4.5" (1.638 m)   Wt 170 lb 9.6 oz (77.4 kg)   LMP 10/20/2018 (Exact Date)   SpO2 98%   BMI 28.83 kg/m   Gen: NAD, alert, non-toxic, well-nourished, well-appearing, pleasant HEENT: Normocephaic, atraumatic. PERRLA, clear conjuctiva, no scleral icterus and injection. Normal EOM.  Hearing intact. TM pearly grey bilaterally with no fluid.  Neck supple with no LAD, nodules, or gross abnormality.  Nares patent with no discharge.  Maxillary and frontal sinuses nontender to palpation.  Oropharynx without erythema and lesions.  Tonsils nonswollen and without exudate.   CV: Regular rate and rhythm.  Normal S1-S2.  No murmur, gallops, S3, S4 appreciated.  Normal capillary refill bilaterally.  Radial pulses  2+ bilaterally. No bilateral lower extremity edema. Resp: Clear to auscultation bilaterally.  No wheezing, rales, rhonchi, or other abnormal lung sounds.  No increased work of breathing appreciated. Abd: Nontender and nondistended on palpation to all 4 quadrants.  Positive bowel sounds. Skin: No obvious rashes, lesions, or trauma.  Normal turgor.  MSK: Normal ROM. Normal strength and tone.  Neuro: Cranial nerves II through VI grossly intact. Gait normal.  Alert and oriented x4.  No obvious abnormal movements. Strength 5/5 on all major muscle groups bilaterally. Psych: Cooperative with exam.  Normal speech. Pleasant. Makes good eye contact. Genitourinary: deferred.   Pertinent Labs & Imaging:  Reviewed in  chart and notable for the following: Cr elevated during UTI encounter in February 2020.  Assessment & Mills:   Problem List Items Addressed This Visit      Active Problems   Encounter to establish care with new doctor - Primary    Patient doing well today.  Family history of diabetes with mildly elevated glucose at 106 during ED visit, will obtain A1c.  Blood pressure within normal limits and appears to be stable over previous visits.  BMI is 28.3.  Lipid panel, A1c, HIV screening  Schedule patient for Pap smear in the coming month  We will obtain medical records for Health Maintenance items  from health [facility], [facility] for prior OB/[facility] records and vaccine records (patient also wants to know her blood type, which should be in her previous OB records)      Relevant Orders   Hemoglobin A1c   Lipid Panel   Health care maintenance     Elevated creatinine during ED visit likely due to UTI.  Will not recheck at this time.  Can consider rechecking if A1c returns elevated.   Health Maintenance Due  Topic Date Due  . HIV Screening  11/11/1999  . TETANUS/TDAP  11/11/2003  . PAP SMEAR-Modifier  11/10/2005  . INFLUENZA VACCINE  03/25/2018   Genia Hotter, M.D. [facility]  PGY -1 10/28/2018, 5:39 PM

## 2018-10-29 LAB — LIPID PANEL
Chol/HDL Ratio: 3.3 ratio (ref 0.0–4.4)
Cholesterol, Total: 161 mg/dL (ref 100–199)
HDL: 49 mg/dL (ref >39)
LDL Calculated: 87 mg/dL (ref 0–99)
Triglycerides: 127 mg/dL (ref 0–149)
VLDL Cholesterol Cal: 25 mg/dL (ref 5–40)

## 2018-10-29 LAB — HEMOGLOBIN A1C
ESTIMATED AVERAGE GLUCOSE: 111 mg/dL
HEMOGLOBIN A1C: 5.5 % (ref 4.8–5.6)

## 2018-11-12 ENCOUNTER — Ambulatory Visit: Payer: No Typology Code available for payment source | Admitting: Family Medicine

## 2019-04-22 ENCOUNTER — Telehealth: Payer: Self-pay | Admitting: Family Medicine

## 2019-04-22 NOTE — Telephone Encounter (Signed)
Clinical info completed on Work form.  Place form in Dr. Julianne Rice box for completion.  Joan Mills, CMA

## 2019-04-22 NOTE — Telephone Encounter (Signed)
Pt dropped off forms to be filled out for employer. Forms put in white team folder at the front desk. jw

## 2019-04-25 NOTE — Telephone Encounter (Signed)
Reviewed, completed, and signed form.  Note routed to RN team inbasket and placed completed form in Clinic RN's office (wall pocket above desk).  Paden Senger E Quindarrius [place], MD  

## 2019-04-26 NOTE — Telephone Encounter (Signed)
Form faxed to [phone] Attention CDS Processing. Copy made for batch scanning.

## 2019-05-13 ENCOUNTER — Other Ambulatory Visit (HOSPITAL_COMMUNITY)
Admission: RE | Admit: 2019-05-13 | Discharge: 2019-05-13 | Disposition: A | Discharge status: Home / Self Care | Payer: 59 | Source: Ambulatory Visit | Attending: Family Medicine | Admitting: Family Medicine

## 2019-05-13 ENCOUNTER — Other Ambulatory Visit: Payer: Self-pay

## 2019-05-13 ENCOUNTER — Ambulatory Visit (INDEPENDENT_AMBULATORY_CARE_PROVIDER_SITE_OTHER): Payer: No Typology Code available for payment source | Admitting: Family Medicine

## 2019-05-13 ENCOUNTER — Encounter: Payer: Self-pay | Admitting: Family Medicine

## 2019-05-13 VITALS — BP 108/62 | HR 78 | Wt 176.5 lb

## 2019-05-13 DIAGNOSIS — Z114 Encounter for screening for human immunodeficiency virus [HIV]: Secondary | ICD-10-CM

## 2019-05-13 DIAGNOSIS — Z0000 Encounter for general adult medical examination without abnormal findings: Secondary | ICD-10-CM | POA: Insufficient documentation

## 2019-05-13 DIAGNOSIS — R7989 Other specified abnormal findings of blood chemistry: Secondary | ICD-10-CM | POA: Insufficient documentation

## 2019-05-13 DIAGNOSIS — Z124 Encounter for screening for malignant neoplasm of cervix: Secondary | ICD-10-CM | POA: Diagnosis not present

## 2019-05-13 NOTE — Patient Instructions (Signed)
Dear Joan Mills,   It was good to see you! Thank you for taking your time to come in to be seen. Today, we discussed the following:   Pap Smear   We will call you if there are abnormal results. If they are normal, I will send you a message on mychart. This goes for the other labs as well!   Be well,   Zettie Cooley, M.D   [facility] [phone]  *Sign up for MyChart for instant access to your health profile, labs, orders, upcoming appointments or to contact your provider with questions*  ===================================================================================

## 2019-05-13 NOTE — Addendum Note (Signed)
Addended by: Londell Moh T on: 05/13/2019 02:55 PM   Modules accepted: Orders

## 2019-05-13 NOTE — Assessment & Plan Note (Signed)
Mildly elevated at 1.01. No other Cr on records. Will follow up lab results

## 2019-05-13 NOTE — Progress Notes (Signed)
      Subjective  CC: Annual Exam  Joan Mills is a 33 y.o. female who presents today with the following problems: Pap Smear  Patient due for pap smear. No other vaginal complaints today.   Pertinent PMFSHx: [id number].  ROS: Pertinent ROS included in HPI.     Objective  Physical Exam:  BP 108/62   Pulse 78   Wt 176 lb 8.6 oz (80.1 kg)   LMP 04/27/2019   SpO2 99%   BMI 29.84 kg/m  GU: External vulva and vagina nonerythematous, without any obvious lesions or rash.  No discharge appreciated.  Normal ruggae of vaginal walls.  Parous cervix is non erythematous and non-friable.  There is no cervical motion tenderness, masses or gross abnormalities appreciated during bimanual exam.      Assessment & Plan    Problem List Items Addressed This Visit      Unprioritized   Elevated serum creatinine    Mildly elevated at 1.01. No other Cr on records. Will follow up lab results      Relevant Orders   Comprehensive metabolic panel   Health care maintenance - Primary   Relevant Orders   Type and screen   Cervicovaginal ancillary only   Pap smear for cervical cancer screening    Other Visit Diagnoses    Encounter for screening for HIV       Relevant Orders   HIV antibody (with reflex)     Wilber Oliphant, M.D.  PGY-2  Family Medicine  [phone] 05/13/2019 2:24 PM

## 2019-05-14 LAB — COMPREHENSIVE METABOLIC PANEL
ALT: 16 IU/L (ref 0–32)
AST: 16 IU/L (ref 0–40)
Albumin/Globulin Ratio: 2 (ref 1.2–2.2)
Albumin: 4.5 g/dL (ref 3.8–4.8)
Alkaline Phosphatase: 66 IU/L (ref 39–117)
BUN/Creatinine Ratio: 16 (ref 9–23)
BUN: 14 mg/dL (ref 6–20)
Bilirubin Total: 0.2 mg/dL (ref 0.0–1.2)
CO2: 25 mmol/L (ref 20–29)
Calcium: 9.4 mg/dL (ref 8.7–10.2)
Chloride: 102 mmol/L (ref 96–106)
Creatinine, Ser: 0.86 mg/dL (ref 0.57–1.00)
GFR calc Af Amer: 102 mL/min/{1.73_m2} (ref >59)
GFR calc non Af Amer: 88 mL/min/{1.73_m2} (ref >59)
Globulin, Total: 2.3 g/dL (ref 1.5–4.5)
Glucose: 85 mg/dL (ref 65–99)
Potassium: 4.4 mmol/L (ref 3.5–5.2)
Sodium: 140 mmol/L (ref 134–144)
Total Protein: 6.8 g/dL (ref 6.0–8.5)

## 2019-05-14 LAB — HIV ANTIBODY (ROUTINE TESTING W REFLEX): HIV Screen 4th Generation wRfx: NONREACTIVE

## 2019-05-23 LAB — CYTOLOGY - PAP
Diagnosis: NEGATIVE
High risk HPV: NEGATIVE
Molecular Disclaimer: 56
Molecular Disclaimer: NORMAL

## 2019-06-14 ENCOUNTER — Encounter: Payer: Self-pay | Admitting: Family Medicine

## 2022-01-28 ENCOUNTER — Encounter: Payer: Self-pay | Admitting: *Deleted

## 2022-02-25 NOTE — Progress Notes (Signed)
Subjective:    Joan Mills - 36 y.o. female MRN [id number]  Date of birth: [birth year]  HPI  Joan Mills is to establish care and annual physical exam.  Current issues and/or concerns: None.   ROS per HPI     Health Maintenance:  Health Maintenance Due  Topic Date Due   Hepatitis C Screening  Never done   TETANUS/TDAP  Never done     Past Medical History: Patient Active Problem List   Diagnosis Date Noted   Elevated serum creatinine 05/13/2019   Pap smear for cervical cancer screening 05/13/2019   Health care maintenance 10/28/2018     Social History   reports that she has never smoked. She has never been exposed to tobacco smoke. She has never used smokeless tobacco. She reports that she does not drink alcohol and does not use drugs.   Family History  family history includes Diabetes in her maternal grandfather and maternal grandmother; Hypertension in her father and mother.   Medications: reviewed and updated   Objective:   Physical Exam BP 127/86 (BP Location: Left Arm, Patient Position: Sitting, Cuff Size: Normal)   Pulse 72   Temp 98.3 F (36.8 C)   Resp 16   Ht 5' 4.57" (1.64 m)   Wt 170 lb 6.4 oz (77.3 kg)   SpO2 98%   BMI 28.74 kg/m   Physical Exam HENT:     Head: Normocephalic and atraumatic.     Right Ear: Tympanic membrane, ear canal and external ear normal.     Left Ear: Tympanic membrane, ear canal and external ear normal.     Nose: Nose normal.     Mouth/Throat:     Mouth: Mucous membranes are moist.     Pharynx: Oropharynx is clear.  Eyes:     Extraocular Movements: Extraocular movements intact.     Conjunctiva/sclera: Conjunctivae normal.     Pupils: Pupils are equal, round, and reactive to light.  Cardiovascular:     Rate and Rhythm: Normal rate and regular rhythm.     Pulses: Normal pulses.     Heart sounds: Normal heart sounds.  Pulmonary:     Effort: Pulmonary effort is normal.     Breath sounds: Normal breath sounds.   Chest:     Comments: Patient declined. Abdominal:     General: Bowel sounds are normal.     Palpations: Abdomen is soft.  Genitourinary:    Comments: Patient declined.  Musculoskeletal:        General: Normal range of motion.     Right shoulder: Normal.     Left shoulder: Normal.     Right upper arm: Normal.     Left upper arm: Normal.     Right elbow: Normal.     Left elbow: Normal.     Right forearm: Normal.     Left forearm: Normal.     Right wrist: Normal.     Left wrist: Normal.     Right hand: Normal.     Left hand: Normal.     Cervical back: Normal, normal range of motion and neck supple.     Thoracic back: Normal.     Lumbar back: Normal.     Right hip: Normal.     Left hip: Normal.     Right upper leg: Normal.     Left upper leg: Normal.     Right knee: Normal.     Left knee: Normal.     Right lower leg:  Normal.     Left lower leg: Normal.     Right ankle: Normal.     Left ankle: Normal.     Right foot: Normal.     Left foot: Normal.  Skin:    General: Skin is warm and dry.     Capillary Refill: Capillary refill takes less than 2 seconds.  Neurological:     General: No focal deficit present.     Mental Status: She is alert and oriented to person, place, and time.  Psychiatric:        Mood and Affect: Mood normal.        Behavior: Behavior normal.    Assessment & Plan:  1. Encounter to establish care 2. Annual physical exam - Counseled on 150 minutes of exercise per week as tolerated, healthy eating (including decreased daily intake of saturated fats, cholesterol, added sugars, sodium), STI prevention, and routine healthcare maintenance. - Patient request blood type screening. - ABO  3. Screening for metabolic disorder - MBO48+TTCN to check kidney function, liver function, and electrolyte balance.  - CMP14+EGFR  4. Screening for deficiency anemia - CBC to screen for anemia. - CBC  5. Diabetes mellitus  - Hemoglobin A1c to screen for  pre-diabetes/diabetes. - Hemoglobin A1c  6. Thyroid disorder screen - TSH to check thyroid function.  - TSH  7. Screening cholesterol level - Lipid panel to screen for high cholesterol.  - Lipid panel  8. Need for hepatitis C screening test - Hepatitis C antibody to screen for hepatitis C.  - Hepatitis C Antibody  9. Need for Tdap vaccination - Administered today in office.  - Tdap vaccine greater than or equal to 7yo IM     Patient was given clear instructions to go to Emergency Department or return to medical center if symptoms don't improve, worsen, or new problems develop.The patient verbalized understanding.  I discussed the assessment and treatment plan with the patient. The patient was provided an opportunity to ask questions and all were answered. The patient agreed with the plan and demonstrated an understanding of the instructions.   The patient was advised to call back or seek an in-person evaluation if the symptoms worsen or if the condition fails to improve as anticipated.    Durene Fruits, NP 03/04/2022, 4:01 PM Primary Care at [facility]

## 2022-03-04 ENCOUNTER — Encounter: Payer: Self-pay | Admitting: Family

## 2022-03-04 ENCOUNTER — Ambulatory Visit (INDEPENDENT_AMBULATORY_CARE_PROVIDER_SITE_OTHER): Payer: BC Managed Care – PPO | Admitting: Family

## 2022-03-04 VITALS — BP 127/86 | HR 72 | Temp 98.3°F | Resp 16 | Ht 64.57 in | Wt 170.4 lb

## 2022-03-04 DIAGNOSIS — Z13220 Encounter for screening for lipoid disorders: Secondary | ICD-10-CM | POA: Diagnosis not present

## 2022-03-04 DIAGNOSIS — Z1329 Encounter for screening for other suspected endocrine disorder: Secondary | ICD-10-CM | POA: Diagnosis not present

## 2022-03-04 DIAGNOSIS — Z0000 Encounter for general adult medical examination without abnormal findings: Secondary | ICD-10-CM

## 2022-03-04 DIAGNOSIS — Z130 Encounter for screening for diseases of the blood and blood-forming organs and certain disorders involving the immune mechanism: Secondary | ICD-10-CM

## 2022-03-04 DIAGNOSIS — Z23 Encounter for immunization: Secondary | ICD-10-CM | POA: Diagnosis not present

## 2022-03-04 DIAGNOSIS — Z131 Encounter for screening for diabetes mellitus: Secondary | ICD-10-CM | POA: Diagnosis not present

## 2022-03-04 DIAGNOSIS — Z7689 Persons encountering health services in other specified circumstances: Secondary | ICD-10-CM

## 2022-03-04 DIAGNOSIS — Z1159 Encounter for screening for other viral diseases: Secondary | ICD-10-CM | POA: Diagnosis not present

## 2022-03-04 DIAGNOSIS — Z13228 Encounter for screening for other metabolic disorders: Secondary | ICD-10-CM

## 2022-03-04 NOTE — Progress Notes (Signed)
.  Pt presents to establish care, and annual physical exam no other concerns

## 2022-03-04 NOTE — Patient Instructions (Signed)

## 2022-03-05 LAB — TSH: TSH: 0.729 u[IU]/mL (ref 0.450–4.500)

## 2022-03-05 LAB — CMP14+EGFR
ALT: 15 IU/L (ref 0–32)
AST: 12 IU/L (ref 0–40)
Albumin/Globulin Ratio: 1.6 (ref 1.2–2.2)
Albumin: 4.4 g/dL (ref 3.9–4.9)
Alkaline Phosphatase: 71 IU/L (ref 44–121)
BUN/Creatinine Ratio: 16 (ref 9–23)
BUN: 13 mg/dL (ref 6–20)
Bilirubin Total: 0.3 mg/dL (ref 0.0–1.2)
CO2: 26 mmol/L (ref 20–29)
Calcium: 9.4 mg/dL (ref 8.7–10.2)
Chloride: 100 mmol/L (ref 96–106)
Creatinine, Ser: 0.8 mg/dL (ref 0.57–1.00)
Globulin, Total: 2.7 g/dL (ref 1.5–4.5)
Glucose: 75 mg/dL (ref 70–99)
Potassium: 4.3 mmol/L (ref 3.5–5.2)
Sodium: 139 mmol/L (ref 134–144)
Total Protein: 7.1 g/dL (ref 6.0–8.5)
eGFR: 97 mL/min/{1.73_m2} (ref >59)

## 2022-03-05 LAB — CBC
Hematocrit: 40.4 % (ref 34.0–46.6)
Hemoglobin: 13.8 g/dL (ref 11.1–15.9)
MCH: 32.4 pg (ref 26.6–33.0)
MCHC: 34.2 g/dL (ref 31.5–35.7)
MCV: 95 fL (ref 79–97)
Platelets: 427 10*3/uL (ref 150–450)
RBC: 4.26 x10E6/uL (ref 3.77–5.28)
RDW: 12.3 % (ref 11.7–15.4)
WBC: 5.6 10*3/uL (ref 3.4–10.8)

## 2022-03-05 LAB — LIPID PANEL
Chol/HDL Ratio: 3.2 ratio (ref 0.0–4.4)
Cholesterol, Total: 172 mg/dL (ref 100–199)
HDL: 54 mg/dL (ref >39)
LDL Chol Calc (NIH): 102 mg/dL — ABNORMAL HIGH (ref 0–99)
Triglycerides: 86 mg/dL (ref 0–149)
VLDL Cholesterol Cal: 16 mg/dL (ref 5–40)

## 2022-03-05 LAB — HEMOGLOBIN A1C
Est. average glucose Bld gHb Est-mCnc: 108 mg/dL
Hgb A1c MFr Bld: 5.4 % (ref 4.8–5.6)

## 2022-03-05 LAB — ABO

## 2022-03-05 LAB — HEPATITIS C ANTIBODY: Hep C Virus Ab: NONREACTIVE

## 2022-05-26 ENCOUNTER — Ambulatory Visit: Payer: BC Managed Care – PPO | Admitting: Family Medicine

## 2022-06-09 ENCOUNTER — Encounter: Payer: Self-pay | Admitting: Family Medicine

## 2022-06-09 ENCOUNTER — Ambulatory Visit (INDEPENDENT_AMBULATORY_CARE_PROVIDER_SITE_OTHER): Payer: BC Managed Care – PPO | Admitting: Family Medicine

## 2022-06-09 ENCOUNTER — Other Ambulatory Visit (HOSPITAL_COMMUNITY)
Admission: RE | Admit: 2022-06-09 | Discharge: 2022-06-09 | Disposition: A | Discharge status: Home / Self Care | Payer: BC Managed Care – PPO | Source: Ambulatory Visit | Attending: Family Medicine | Admitting: Family Medicine

## 2022-06-09 VITALS — BP 144/79 | HR 85 | Temp 98.1°F | Resp 16 | Wt 174.8 lb

## 2022-06-09 DIAGNOSIS — Z113 Encounter for screening for infections with a predominantly sexual mode of transmission: Secondary | ICD-10-CM

## 2022-06-09 DIAGNOSIS — Z01419 Encounter for gynecological examination (general) (routine) without abnormal findings: Secondary | ICD-10-CM

## 2022-06-09 NOTE — Progress Notes (Unsigned)
Patient is here for well women pap. Patient said there are no other concerns today

## 2022-06-10 LAB — CERVICOVAGINAL ANCILLARY ONLY
Bacterial Vaginitis (gardnerella): POSITIVE — AB
Candida Glabrata: NEGATIVE
Candida Vaginitis: POSITIVE — AB
Chlamydia: NEGATIVE
Comment: NEGATIVE
Comment: NEGATIVE
Comment: NEGATIVE
Comment: NEGATIVE
Comment: NEGATIVE
Comment: NORMAL
Neisseria Gonorrhea: NEGATIVE
Trichomonas: NEGATIVE

## 2022-06-11 ENCOUNTER — Encounter: Payer: Self-pay | Admitting: Family Medicine

## 2022-06-11 ENCOUNTER — Other Ambulatory Visit: Payer: Self-pay | Admitting: Family Medicine

## 2022-06-11 LAB — CYTOLOGY - PAP
Adequacy: ABSENT
Diagnosis: NEGATIVE

## 2022-06-11 MED ORDER — METRONIDAZOLE 500 MG PO TABS: 500.0000 mg | ORAL_TABLET | Freq: Two times a day (BID) | ORAL | 0 refills | Status: AC

## 2022-06-11 MED ORDER — FLUCONAZOLE 150 MG PO TABS: 150.0000 mg | ORAL_TABLET | Freq: Once | ORAL | 0 refills | Status: AC

## 2022-06-11 NOTE — Progress Notes (Signed)
Established Patient Office Visit  Subjective    Patient ID: Joan Mills, female    DOB: 14-Aug-1986  Age: 36 y.o. MRN: [id number]  CC:  Chief Complaint  Patient presents with   Gynecologic Exam    HPI Tai Coulson presents for routine pap and pelvic. Patient denies acute complaints or concerns.    Outpatient Encounter Medications as of 06/09/2022  Medication Sig   naproxen (NAPROSYN) 500 MG tablet Take 1 tablet (500 mg total) by mouth 2 (two) times daily.   No facility-administered encounter medications on file as of 06/09/2022.    Past Medical History:  Diagnosis Date   Breast mass, right 10/17/2014   Present since patient was age 20. Continues to be stable. U/S on 10/17/2014    Past Surgical History:  Procedure Laterality Date   ACHILLES TENDON SURGERY     CESAREAN SECTION     TUBAL LIGATION Bilateral 2006    Family History  Problem Relation Age of Onset   Hypertension Mother    Hypertension Father    Diabetes Maternal Grandmother    Diabetes Maternal Grandfather     Social History   Socioeconomic History   Marital status: Single    Spouse name: Not on file   Number of children: Not on file   Years of education: Not on file   Highest education level: Not on file  Occupational History   Not on file  Tobacco Use   Smoking status: Never    Passive exposure: Never   Smokeless tobacco: Never  Vaping Use   Vaping Use: Never used  Substance and Sexual Activity   Alcohol use: No   Drug use: No   Sexual activity: Yes    Partners: Male    Birth control/protection: None  Other Topics Concern   Not on file  Social History Narrative   Lives with her partner - [name]   52 year old daughter "Cha-Nya" [place]    57 year old son "[name]"    37 year old son "[name]"   Occasionally in contact with Dad    She owns a car.    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   For fun, she reads and hangs out with her children.   Social Determinants of Health   Financial Resource Strain: Not on file  Food Insecurity: Not on file  Transportation Needs: Not on file  Physical Activity: Not on file  Stress: Not on file  Social Connections: Not on file  Intimate Partner Violence: Not on file    Review of Systems  All other systems reviewed and are negative.       Objective    BP (!) 144/79   Pulse 85   Temp 98.1 F (36.7 C) (Oral)   Resp 16   Wt 174 lb 12.8 oz (79.3 kg)   SpO2 98%   BMI 29.48 kg/m   Physical Exam Vitals and nursing note reviewed.  Constitutional:      General: She is not in acute distress. Cardiovascular:     Rate and Rhythm: Normal rate and regular rhythm.  Pulmonary:     Effort: Pulmonary effort is normal.     Breath sounds: Normal breath sounds.  Abdominal:     Hernia: There is no hernia in the left inguinal area or right inguinal area.  Genitourinary:    Exam position:  Supine.     Labia:        Right: No lesion.        Left: No lesion.      Vagina: Normal.     Cervix: Normal.     Uterus: Normal.      Adnexa: Right adnexa normal and left adnexa normal.     Rectum: Normal.  Neurological:     Mental Status: She is alert.         Assessment & Plan:   1. Pap smear, as part of routine gynecological examination  - Cytology - PAP  2. Screening for STDs (sexually transmitted diseases)  - Cervicovaginal ancillary only    No follow-ups on file.   Becky Sax, MD

## 2022-06-11 NOTE — Telephone Encounter (Signed)
Patient was called and identified. Patient was given lab results and had no questions  

## 2024-03-11 ENCOUNTER — Emergency Department (HOSPITAL_COMMUNITY)

## 2024-03-11 ENCOUNTER — Emergency Department (HOSPITAL_COMMUNITY)
Admission: EM | Admit: 2024-03-11 | Discharge: 2024-03-11 | Disposition: A | Discharge status: Home / Self Care | Attending: Emergency Medicine | Admitting: Emergency Medicine

## 2024-03-11 DIAGNOSIS — S299XXA Unspecified injury of thorax, initial encounter: Secondary | ICD-10-CM | POA: Insufficient documentation

## 2024-03-11 DIAGNOSIS — Y92410 Unspecified street and highway as the place of occurrence of the external cause: Secondary | ICD-10-CM | POA: Diagnosis not present

## 2024-03-11 DIAGNOSIS — M5126 Other intervertebral disc displacement, lumbar region: Secondary | ICD-10-CM | POA: Insufficient documentation

## 2024-03-11 DIAGNOSIS — R109 Unspecified abdominal pain: Secondary | ICD-10-CM | POA: Diagnosis not present

## 2024-03-11 DIAGNOSIS — S0990XA Unspecified injury of head, initial encounter: Secondary | ICD-10-CM | POA: Diagnosis present

## 2024-03-11 DIAGNOSIS — M51369 Other intervertebral disc degeneration, lumbar region without mention of lumbar back pain or lower extremity pain: Secondary | ICD-10-CM

## 2024-03-11 LAB — CBC WITH DIFFERENTIAL/PLATELET
Abs Immature Granulocytes: 0.02 K/uL (ref 0.00–0.07)
Basophils Absolute: 0 K/uL (ref 0.0–0.1)
Basophils Relative: 0 %
Eosinophils Absolute: 0.1 K/uL (ref 0.0–0.5)
Eosinophils Relative: 1 %
HCT: 40.3 % (ref 36.0–46.0)
Hemoglobin: 13.3 g/dL (ref 12.0–15.0)
Immature Granulocytes: 0 %
Lymphocytes Relative: 38 %
Lymphs Abs: 3.4 K/uL (ref 0.7–4.0)
MCH: 31.7 pg (ref 26.0–34.0)
MCHC: 33 g/dL (ref 30.0–36.0)
MCV: 96.2 fL (ref 80.0–100.0)
Monocytes Absolute: 0.7 K/uL (ref 0.1–1.0)
Monocytes Relative: 8 %
Neutro Abs: 4.7 K/uL (ref 1.7–7.7)
Neutrophils Relative %: 53 %
Platelets: 388 K/uL (ref 150–400)
RBC: 4.19 MIL/uL (ref 3.87–5.11)
RDW: 11.9 % (ref 11.5–15.5)
WBC: 9 K/uL (ref 4.0–10.5)
nRBC: 0 % (ref 0.0–0.2)

## 2024-03-11 LAB — HCG, SERUM, QUALITATIVE: Preg, Serum: NEGATIVE

## 2024-03-11 LAB — COMPREHENSIVE METABOLIC PANEL WITH GFR
ALT: 17 U/L (ref 0–44)
AST: 21 U/L (ref 15–41)
Albumin: 3.7 g/dL (ref 3.5–5.0)
Alkaline Phosphatase: 49 U/L (ref 38–126)
Anion gap: 10 (ref 5–15)
BUN: 13 mg/dL (ref 6–20)
CO2: 23 mmol/L (ref 22–32)
Calcium: 8.8 mg/dL — ABNORMAL LOW (ref 8.9–10.3)
Chloride: 104 mmol/L (ref 98–111)
Creatinine, Ser: 1.04 mg/dL — ABNORMAL HIGH (ref 0.44–1.00)
GFR, Estimated: >60 mL/min (ref >60)
Glucose, Bld: 99 mg/dL (ref 70–99)
Potassium: 3.9 mmol/L (ref 3.5–5.1)
Sodium: 137 mmol/L (ref 135–145)
Total Bilirubin: 0.8 mg/dL (ref 0.0–1.2)
Total Protein: 6.9 g/dL (ref 6.5–8.1)

## 2024-03-11 MED ORDER — METHYLPREDNISOLONE SODIUM SUCC 125 MG IJ SOLR
125.0000 mg | Freq: Once | INTRAMUSCULAR | Status: AC
  Administered 2024-03-11: 125 mg via INTRAVENOUS
  Filled 2024-03-11: qty 2

## 2024-03-11 MED ORDER — CYCLOBENZAPRINE HCL 5 MG PO TABS: 5.0000 mg | ORAL_TABLET | Freq: Three times a day (TID) | ORAL | 0 refills | Status: AC | PRN

## 2024-03-11 MED ORDER — MORPHINE SULFATE (PF) 4 MG/ML IV SOLN
4.0000 mg | Freq: Once | INTRAVENOUS | Status: AC
  Administered 2024-03-11: 4 mg via INTRAVENOUS
  Filled 2024-03-11: qty 1

## 2024-03-11 MED ORDER — HYDROCODONE-ACETAMINOPHEN 5-325 MG PO TABS: 1.0000 | ORAL_TABLET | Freq: Four times a day (QID) | ORAL | 0 refills | Status: AC | PRN

## 2024-03-11 MED ORDER — METHYLPREDNISOLONE 4 MG PO TBPK: ORAL_TABLET | ORAL | 0 refills | Status: AC

## 2024-03-11 MED ORDER — IOHEXOL 350 MG/ML SOLN
75.0000 mL | Freq: Once | INTRAVENOUS | Status: AC | PRN
  Administered 2024-03-11: 75 mL via INTRAVENOUS

## 2024-03-11 NOTE — ED Notes (Signed)
 Pt denies any tingling in any extremities, headache or blurred vision.
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
## 2024-03-11 NOTE — ED Notes (Signed)
 Patient transported to MRI

## 2024-03-11 NOTE — ED Notes (Signed)
 This nurse called CT to see where patient was in line for CT. CT stated they will be sending someone shortly to transport patient to CT.

## 2024-03-11 NOTE — Discharge Instructions (Addendum)
 As we discussed, you have a disc bulge at L4 and 5 that is causing your numbness in your leg  I have prescribed steroids to help you with the inflammation  You are likely going to have numbness for several days to weeks  I have prescribed Norco for pain  I have also prescribed Flexeril for muscle relaxant  You need to call neurosurgery next week for follow-up  Return to ER if you have worse numbness or weakness or trouble walking

## 2024-03-11 NOTE — ED Notes (Signed)
 Patient transported to CT

## 2024-03-11 NOTE — ED Notes (Signed)
 Patient returned from CT
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
## 2024-03-11 NOTE — ED Notes (Signed)
 Patient returned from MRI.

## 2024-06-16 ENCOUNTER — Other Ambulatory Visit: Payer: Self-pay

## 2024-06-16 ENCOUNTER — Ambulatory Visit (HOSPITAL_BASED_OUTPATIENT_CLINIC_OR_DEPARTMENT_OTHER): Payer: Self-pay | Attending: Surgery | Admitting: Physical Therapy

## 2024-06-16 DIAGNOSIS — M79604 Pain in right leg: Secondary | ICD-10-CM | POA: Diagnosis present

## 2024-06-16 DIAGNOSIS — M6281 Muscle weakness (generalized): Secondary | ICD-10-CM | POA: Diagnosis present

## 2024-06-16 DIAGNOSIS — M5459 Other low back pain: Secondary | ICD-10-CM | POA: Insufficient documentation
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
## 2024-06-17 ENCOUNTER — Encounter (HOSPITAL_BASED_OUTPATIENT_CLINIC_OR_DEPARTMENT_OTHER): Payer: Self-pay | Admitting: Physical Therapy

## 2024-06-22 ENCOUNTER — Encounter (HOSPITAL_BASED_OUTPATIENT_CLINIC_OR_DEPARTMENT_OTHER): Payer: Self-pay | Admitting: Physical Therapy

## 2024-06-22 ENCOUNTER — Ambulatory Visit (HOSPITAL_BASED_OUTPATIENT_CLINIC_OR_DEPARTMENT_OTHER): Payer: Self-pay | Admitting: Physical Therapy

## 2024-06-22 DIAGNOSIS — M5459 Other low back pain: Secondary | ICD-10-CM

## 2024-06-22 DIAGNOSIS — M6281 Muscle weakness (generalized): Secondary | ICD-10-CM

## 2024-06-22 DIAGNOSIS — M79604 Pain in right leg: Secondary | ICD-10-CM
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
## 2024-07-27 ENCOUNTER — Ambulatory Visit (HOSPITAL_BASED_OUTPATIENT_CLINIC_OR_DEPARTMENT_OTHER): Payer: Self-pay | Attending: Surgery | Admitting: Physical Therapy

## 2024-07-27 ENCOUNTER — Encounter (HOSPITAL_BASED_OUTPATIENT_CLINIC_OR_DEPARTMENT_OTHER): Payer: Self-pay | Admitting: Physical Therapy

## 2024-07-27 DIAGNOSIS — M6281 Muscle weakness (generalized): Secondary | ICD-10-CM | POA: Insufficient documentation

## 2024-07-27 DIAGNOSIS — M79604 Pain in right leg: Secondary | ICD-10-CM | POA: Insufficient documentation

## 2024-07-27 DIAGNOSIS — M5459 Other low back pain: Secondary | ICD-10-CM | POA: Insufficient documentation

## 2024-07-27 NOTE — Therapy (Signed)
 OUTPATIENT PHYSICAL THERAPY THORACOLUMBAR TREATMENT    Patient Name: Joan Mills MRN: [id number] DOB:Jun 25, 1986, 38 y.o., female Today's Date: 07/28/2024  END OF SESSION:  PT End of Session - 07/27/24 1641     Visit Number 3    Number of Visits 12    Date for Recertification  07/28/24    PT Start Time 1625    PT Stop Time 1708    PT Time Calculation (min) 43 min    Activity Tolerance Patient tolerated treatment well    Behavior During Therapy [facility] for tasks assessed/performed            Past Medical History:  Diagnosis Date   Breast mass, right 10/17/2014   Present since patient was age 7. Continues to be stable. U/S on 10/17/2014   Past Surgical History:  Procedure Laterality Date   ACHILLES TENDON SURGERY     CESAREAN SECTION     TUBAL LIGATION Bilateral 2006   Patient Active Problem List   Diagnosis Date Noted   Elevated serum creatinine 05/13/2019   Pap smear for cervical cancer screening 05/13/2019   Health care maintenance 10/28/2018     REFERRING PROVIDER: Tomlinson, Sara Caylin, PA-C  REFERRING DIAG: [id number] (ICD-10-CM) - Annular tear of lumbar disc / Other intervertebral disc degeneration, lumbar region without mention of lumbar back pain or lower extremity pain  Rationale for Evaluation and Treatment: Rehabilitation  THERAPY DIAG:  Other low back pain  Pain in right leg  Muscle weakness (generalized)  ONSET DATE: 03/11/2024  SUBJECTIVE:                                                                                                                                                                                           SUBJECTIVE STATEMENT:  Pt states she feels good with her home exercises, they don't bother her.  Pt has increased pain down her R LE after walking and shopping.  Her pain shoots down her R LE, but she feels more in the calf.  Pt is limited with standing due to pain.  Pt walks slower and is limited with ambulation distance.  Pt  states everything is the same, no change in her sx's.   Pt denies any adverse effects after prior treatment.  Pt reports compliance with HEP.       PERTINENT HISTORY:  R Achilles tendon surgery  PAIN:  NPRS:  3/10   Location:  R >L sided lumbar Type:  dull, burning, numbness in R LE Aggravating factors:  unpredictable  Easing factors:  sitting still but only for so long   PRECAUTIONS: None   WEIGHT BEARING RESTRICTIONS:  No  FALLS:  Has patient fallen in last 6 months? No  LIVING ENVIRONMENT: Lives with:  domestic partner Lives in: 1 story apartment Stairs: no.  Small stoop to enter.   Has following equipment at home: None  OCCUPATION: Tailor  PLOF: Independent  PATIENT GOALS: reduce pain, to be able to perform cartwheels with niece, and to be able to walk my normal pace.     OBJECTIVE:  Note: Objective measures were completed at Evaluation unless otherwise noted.  DIAGNOSTIC FINDINGS:  MRI on 03/11/24: IMPRESSION: 1. L4-5 mild left asymmetric disc bulge with minimal narrowing of the left lateral recess. No spinal canal or neural foraminal stenosis. 2. L5-S1 small central disc protrusion with annular fissure. No spinal canal or neural foraminal stenosis.  PATIENT SURVEYS:  Modified Oswestry Disability Index: 11 = 22%  COGNITION: Overall cognitive status: Within functional limits for tasks assessed     SENSATION: 2+ sensation to LT t/o bilat LE dermatomes   PALPATION: TTP lower lumbar SP's and R sided Tp's.  TTP in R sided lumbar paraspinals though none on L.  She has soft tissue tightness in bilat lumbar paraspinals.    LUMBAR ROM:   AROM eval  Flexion WFL with R sided lumbar pain  Extension 75% with central lumbar pain  Right lateral flexion WFL, pain when returning to standing  Left lateral flexion WFL  Right rotation WFL  Left rotation WFL   (Blank rows = not tested)  LOWER EXTREMITY STRENGTH:     MMT  Right eval Left eval  Hip flexion 5/5  5/5  Hip extension 4/5 5/5  Hip abduction 5/5 5/5  Hip adduction    Hip internal rotation    Hip external rotation 5/5 5/5  Knee flexion 4+/5 5/5  Knee extension 4/5 5/5  Ankle dorsiflexion 5/5 5/5  Ankle plantarflexion WFL seated WFL seated  Ankle inversion    Ankle eversion     (Blank rows = not tested)  LOWER EXTREMITY ROM:    AROM Right eval Left eval  Hip flexion    Hip extension    Hip abduction [facility]  Hip adduction    Hip internal rotation    Hip external rotation    Knee flexion    Knee extension [facility]  Ankle dorsiflexion [facility]  Ankle plantarflexion [facility] WFL  Ankle inversion    Ankle eversion     (Blank rows = not tested)  LUMBAR SPECIAL TESTS:  SLR test:  negative bilat Slump test:  negative bilat Prone prop up x 1 min 45 sec.  Pt had increased back pain and began feeling numbness in R posterior thigh     GAIT: Assistive device utilized: None Level of assistance: Complete Independence Comments: Pt ambulates slightly favoring R LE.  TREATMENT:  07/27/24  Reviewed current function, HEP compliance, pain level, and response to prior treatment.  Prone lying x 2 mins Prone lying x 2 mins with pillow under waist Prone prop up x 1 min  PPU approx 7 reps through a limited ROM.  Supine lumbar rotation x 10 bilat Supine alt LE extension with TrA approx 10 bilat Supine bridge with TrA Pt received supine manual HS stretch with passive AP's for neural flossing 3x30 sec  06/22/24  HS stretches 2x30 seconds B Piriformis stretches 2x30 seconds B  TA sets supine 10x3 seconds  PPT to lumbar arch 10x3 seconds each way Sciatic flossing 3x30 seconds Gastroc stretches 3x30 seconds supine R LE  Scap retractions from chair no resistance 10x3 seconds- had pain flare, had been arching back when doing this exercise  Prone press up to  elbows x10, tolerable  Tried prone press up straight arms- painful, stopped      PATIENT EDUCATION:  Education details:  PT used a spinal model to educate pt concerning relevant anatomy and dx.  Objective findings, POC, HEP, rationale of interventions, and what to expect next treatment.   Person educated: Patient Education method: Explanation, Demonstration, Tactile cues, Verbal cues, and Handouts Education comprehension: verbalized understanding, returned demonstration, verbal cues required, tactile cues required, and needs further education  HOME EXERCISE PROGRAM:  Access Code: [email or web address] URL: [email or web address] Date: 06/22/2024 Prepared by: Josette Rough  Exercises - Supine Transversus Abdominis Bracing - Hands on Stomach  - 2 x daily - 7 x weekly - 2 sets - 10 reps - Prone Press Up On Elbows  - 1-2 x daily - 7 x weekly - 1-2 sets - 10 reps     ASSESSMENT:  CLINICAL IMPRESSION:  Pt presents to treatment reporting no change in sx's though has only received 3 PT visits since 06/16/24.  She continues to have pain which travels down her R LE, but she feels more in the calf.  Pt is limited with ambulation distance and has pain with shopping.  She is also limited with standing duration.  PT performed Mckenzie exercises and assessed pt response.  Pt had no change after prone lying and reports she could feel some tingling in R sided lumbar with prone lying.  Pt reports having some burning in her back with prone lying with pillow under waist.  Pt had no change when standing after prone lying with and wihout pillow under waist.  She has limited ROM with PPU.  PT instructed pt to not perform PPU into a painful range and she continued to perform PPU exercise in a limited ROM.  PT instructed pt in core exercises including TrA activation with supine table exercises.  PT performed manual neural mobilization in supine and she states the nerve flossing felt good.  She states she feels  good after treatment.  Pt states she can feel her back more, but has no increased pain or sx's.  Pt should benefit from continued skilled PT to address impairments and improve overall function.         OBJECTIVE IMPAIRMENTS: decreased activity tolerance, decreased endurance, decreased mobility, difficulty walking, decreased ROM, decreased strength, increased fascial restrictions, impaired flexibility, and pain.   ACTIVITY LIMITATIONS: sitting, standing, and locomotion level  PARTICIPATION LIMITATIONS: cleaning, driving, shopping, and community activity  PERSONAL FACTORS:    REHAB POTENTIAL: Good  CLINICAL DECISION MAKING: Stable/uncomplicated  EVALUATION COMPLEXITY: Low   GOALS:   SHORT TERM GOALS: Target date: 07/07/2024   Pt will be independent and  compliant with HEP for improved pain, strength, and function.  Baseline: Goal status: INITIAL  2.  Pt will report at least a 25% improvement in pain and sx's overall.  Baseline:  Goal status: INITIAL  3.  Pt will demo improved core strength as evidenced by performance and progression of exercises without adverse effects for improved tolerance with and performance of daily activities and functional mobility.   Baseline:  Goal status: INITIAL   LONG TERM GOALS: Target date: 07/28/2024  Pt will report she is able to ambulate her normal community distance without increased pain.  Baseline:  Goal status: INITIAL  2.  Pt will report pain centralizing to her back in order for improved tolerance with ADLs/IADLs and functional mobility.  Baseline:  Goal status: INITIAL  3.  Pt will demo improved R LE strength to 5/5 MMT for improved functional mobility.   Baseline:  Goal status: INITIAL  4.  Pt will demo proper form with squat to lift in order to perform functional lifting activities without bending for reduced stress on lumbar and decreased isk of injury. Baseline:  Goal status: INITIAL  5.  Pt will report at least a 60%  improvement in standing tolerance for improved performance of IADLs.  Baseline:  Goal status: INITIAL    PLAN:  PT FREQUENCY: 2x/week  PT DURATION: 6 weeks  PLANNED INTERVENTIONS: 97164- PT Re-evaluation, 97750- Physical Performance Testing, 97110-Therapeutic exercises, 97530- Therapeutic activity, [id number]- Neuromuscular re-education, 97535- Self Care, [id number]- Manual therapy, [id number]- Gait training, [phone]- Aquatic Therapy, [phone]- Electrical stimulation (unattended), [phone]- Electrical stimulation (manual), [id number]- Ultrasound, 79439 (1-2 muscles), 20561 (3+ muscles)- Dry Needling, Patient/Family education, Taping, Joint mobilization, Spinal mobilization, Cryotherapy, and Moist heat.  PLAN FOR NEXT SESSION: Assess prone lying and prone prop up again next visit.  Core strengthening.  STM to lumbar paraspinals.    Leigh Minerva III PT, DPT 07/29/24 6:58 AM

## 2024-07-29 ENCOUNTER — Ambulatory Visit (HOSPITAL_BASED_OUTPATIENT_CLINIC_OR_DEPARTMENT_OTHER): Payer: Self-pay | Admitting: Physical Therapy

## 2024-07-29 ENCOUNTER — Encounter (HOSPITAL_BASED_OUTPATIENT_CLINIC_OR_DEPARTMENT_OTHER): Payer: Self-pay | Admitting: Physical Therapy

## 2024-07-29 DIAGNOSIS — M6281 Muscle weakness (generalized): Secondary | ICD-10-CM

## 2024-07-29 DIAGNOSIS — M79604 Pain in right leg: Secondary | ICD-10-CM

## 2024-07-29 DIAGNOSIS — M5459 Other low back pain: Secondary | ICD-10-CM

## 2024-07-29 NOTE — Therapy (Signed)
 OUTPATIENT PHYSICAL THERAPY THORACOLUMBAR TREATMENT   PROGRESS NOTE   Patient Name: Joan Mills MRN: [id number] DOB:February 02, 1986, 38 y.o., female Today's Date: 07/29/2024  END OF SESSION:  PT End of Session - 07/29/24 1112     Visit Number 4    Number of Visits 16    Date for Recertification  09/09/24    PT Start Time 1026    PT Stop Time 1109    PT Time Calculation (min) 43 min    Activity Tolerance Patient limited by pain    Behavior During Therapy [facility] for tasks assessed/performed             Past Medical History:  Diagnosis Date   Breast mass, right 10/17/2014   Present since patient was age 75. Continues to be stable. U/S on 10/17/2014   Past Surgical History:  Procedure Laterality Date   ACHILLES TENDON SURGERY     CESAREAN SECTION     TUBAL LIGATION Bilateral 2006   Patient Active Problem List   Diagnosis Date Noted   Elevated serum creatinine 05/13/2019   Pap smear for cervical cancer screening 05/13/2019   Health care maintenance 10/28/2018     REFERRING PROVIDER: Tomlinson, Sara Caylin, PA-C  REFERRING DIAG: [phone] (ICD-10-CM) - Annular tear of lumbar disc / Other intervertebral disc degeneration, lumbar region without mention of lumbar back pain or lower extremity pain  Rationale for Evaluation and Treatment: Rehabilitation  THERAPY DIAG:  Other low back pain  Pain in right leg  Muscle weakness (generalized)  ONSET DATE: 03/11/2024  SUBJECTIVE:                                                                                                                                                                                           SUBJECTIVE STATEMENT:  Pt states she felt better after prior treatment.  Pt states the exercises helped last treatment.  Pt reports the weather has her pain flared up.  Pt reports 50% improvement in pain and sx's overall.  Pt reports compliance with HEP.       PERTINENT HISTORY:  R Achilles tendon surgery  PAIN:   NPRS:  5/10 current, 6/10 worst 0/10 best Location:  R >L sided lumbar. No sx's in R LE in sitting currently.  Type:  dull, burning, numbness in R LE Aggravating factors:  unpredictable  Easing factors:  sitting still but only for so long   PRECAUTIONS: None   WEIGHT BEARING RESTRICTIONS: No  FALLS:  Has patient fallen in last 6 months? No  LIVING ENVIRONMENT: Lives with:  domestic partner Lives in: 1 story apartment Stairs: no.  Small stoop  to enter.   Has following equipment at home: None  OCCUPATION: Tailor  PLOF: Independent  PATIENT GOALS: reduce pain, to be able to perform cartwheels with niece, and to be able to walk my normal pace.     OBJECTIVE:  Note: Objective measures were completed at Evaluation unless otherwise noted.  DIAGNOSTIC FINDINGS:  MRI on 03/11/24: IMPRESSION: 1. L4-5 mild left asymmetric disc bulge with minimal narrowing of the left lateral recess. No spinal canal or neural foraminal stenosis. 2. L5-S1 small central disc protrusion with annular fissure. No spinal canal or neural foraminal stenosis.  PATIENT SURVEYS:  Modified Oswestry Disability Index:  11 = 22%  (Eval) 10 = 20%  (07/29/24)  COGNITION: Overall cognitive status: Within functional limits for tasks assessed     SENSATION: 2+ sensation to LT t/o bilat LE dermatomes   LUMBAR ROM:   AROM eval  Flexion WFL with R sided lumbar pain  Extension 75% with central lumbar pain  Right lateral flexion WFL, pain when returning to standing  Left lateral flexion WFL  Right rotation [facility]  Left rotation WFL   (Blank rows = not tested)  LOWER EXTREMITY STRENGTH:     MMT  Right eval Left eval Right 12/5  Hip flexion 5/5 5/5   Hip extension 4/5 5/5 4/5 with pain  Hip abduction 5/5 5/5   Hip adduction     Hip internal rotation     Hip external rotation 5/5 5/5   Knee flexion 4+/5 5/5 4+/5  Knee extension 4/5 5/5   Ankle dorsiflexion 5/5 5/5   Ankle plantarflexion WFL seated  WFL seated   Ankle inversion     Ankle eversion      (Blank rows = not tested)  LOWER EXTREMITY ROM:    AROM Right eval Left eval  Hip flexion    Hip extension    Hip abduction [facility]  Hip adduction    Hip internal rotation    Hip external rotation    Knee flexion    Knee extension [facility]  Ankle dorsiflexion [facility]  Ankle plantarflexion [facility] WFL  Ankle inversion    Ankle eversion     (Blank rows = not tested)        TREATMENT:                                                                                                                                07/29/24 Reviewed current function, pain levels, HEP compliance, and response to prior treatment.  Prone lying x 2 mins Prone prop up x 30 sec, 2x20 sec Prone prop up x 5 repetitions  PT assessed strength.  Pt had pain with hip extension MMT.  See above Prone lying x 1 min Prone prop up x30 sec  Pt received supine manual HS stretch with passive AP's for neural flossing 3x30 sec LAQ with AP's for nn flossing 3 reps of 10 ankle pumps. Updated  HEP with exercise and gave pt a handout.  PT educated pt in correct form, appropriate response, and appropriate frequency.   Pt completed Modified Oswestry.  See above   07/27/24  Reviewed current function, HEP compliance, pain level, and response to prior treatment.  Prone lying x 2 mins Prone lying x 2 mins with pillow under waist Prone prop up x 1 min  PPU approx 7 reps through a limited ROM.  Supine lumbar rotation x 10 bilat Supine alt LE extension with TrA approx 10 bilat Supine bridge with TrA Pt received supine manual HS stretch with passive AP's for neural flossing 3x30 sec  06/22/24  HS stretches 2x30 seconds B Piriformis stretches 2x30 seconds B  TA sets supine 10x3 seconds  PPT to lumbar arch 10x3 seconds each way Sciatic flossing 3x30 seconds Gastroc stretches 3x30 seconds supine R LE  Scap retractions from chair no resistance 10x3 seconds- had pain  flare, had been arching back when doing this exercise  Prone press up to elbows x10, tolerable  Tried prone press up straight arms- painful, stopped      PATIENT EDUCATION:  Education details:  goal progress, relevant anatomy, dx, objective findings, POC, HEP, appropriate sx response, and rationale of interventions.   Person educated: Patient Education method: Explanation, Demonstration, Tactile cues, Verbal cues, and Handouts Education comprehension: verbalized understanding, returned demonstration, verbal cues required, tactile cues required, and needs further education  HOME EXERCISE PROGRAM:  Access Code: [email or web address] URL: [email or web address] Date: 06/22/2024 Prepared by: Josette Rough  Exercises - Supine Transversus Abdominis Bracing - Hands on Stomach  - 2 x daily - 7 x weekly - 2 sets - 10 reps - Prone Press Up On Elbows  - 1-2 x daily - 7 x weekly - 1-2 sets - 10 reps  Updated HEP: - Knee extension with Ankle Pumps  - 2 x daily - 7 x weekly - 2-3 reps    ASSESSMENT:  CLINICAL IMPRESSION:  Pt presents to treatment stating she felt better after prior treatment and the exercises helped.  She states the weather has her back flared up today.  Her worst pain has improved.  PT assessed R LE strength and she had no change compared to evaluation.  Pt had pain with hip extension strength testing.  PT performed Mckenzie exercises today.  She felt a burning sensation across her back though no radicular sx's in LE with prone lying.  Pt reports having R LE sx's in hooklying position and unable to tolerate hooklying position today.  Pt performed prone lying and prone prone prop up though still had R LE sx's in the hooklying position.  Pt states she felt good with neural mobilization techniques and exercises.  PT updated HEP with seated nerve flossing exercise.  Pt had no significant change in self perceived disability with Modified Oswestry improving by 2%.  Pt met STG's #1,2.   She reports her pain being no worse after treatment.  Pt should benefit from cont skilled PT to address ongoing goals and impairments and to assist with restoring desired level of function.        OBJECTIVE IMPAIRMENTS: decreased activity tolerance, decreased endurance, decreased mobility, difficulty walking, decreased ROM, decreased strength, increased fascial restrictions, impaired flexibility, and pain.   ACTIVITY LIMITATIONS: sitting, standing, and locomotion level  PARTICIPATION LIMITATIONS: cleaning, driving, shopping, and community activity  PERSONAL FACTORS:    REHAB POTENTIAL: Good  CLINICAL DECISION MAKING: Stable/uncomplicated  EVALUATION COMPLEXITY: Low   GOALS:   SHORT TERM  GOALS: Target date: 07/07/2024   Pt will be independent and compliant with HEP for improved pain, strength, and function.  Baseline: Goal status: GOAL MET  12/5  2.  Pt will report at least a 25% improvement in pain and sx's overall.  Baseline:  Goal status: GOAL MET  12/5  3.  Pt will demo improved core strength as evidenced by performance and progression of exercises without adverse effects for improved tolerance with and performance of daily activities and functional mobility.   Baseline:  Goal status:  ONGOING     LONG TERM GOALS: Target date: 09/09/2024  Pt will report she is able to ambulate her normal community distance without increased pain.  Baseline:  Goal status: ONGOING  2.  Pt will report pain centralizing to her back in order for improved tolerance with ADLs/IADLs and functional mobility.  Baseline:  Goal status: NOT MET  3.  Pt will demo improved R LE strength to 5/5 MMT for improved functional mobility.   Baseline:  Goal status: NOT MET  4.  Pt will demo proper form with squat to lift in order to perform functional lifting activities without bending for reduced stress on lumbar and decreased isk of injury. Baseline:  Goal status: ONGOING  5.  Pt will report at  least a 60% improvement in standing tolerance for improved performance of IADLs.  Baseline:  Goal status: ONGOING    PLAN:  PT FREQUENCY: 2x/week  PT DURATION: 6 weeks  PLANNED INTERVENTIONS: 97164- PT Re-evaluation, 97750- Physical Performance Testing, 97110-Therapeutic exercises, 97530- Therapeutic activity, [id number]- Neuromuscular re-education, 97535- Self Care, 02859- Manual therapy, [id number]- Gait training, [phone]- Aquatic Therapy, [phone]- Electrical stimulation (unattended), [phone]- Electrical stimulation (manual), [id number]- Ultrasound, 79439 (1-2 muscles), 20561 (3+ muscles)- Dry Needling, Patient/Family education, Taping, Joint mobilization, Spinal mobilization, Cryotherapy, and Moist heat.  PLAN FOR NEXT SESSION: Assess Mckenzie exercises again next visit.  Core strengthening.  STM to lumbar paraspinals.    Leigh Minerva III PT, DPT 07/30/24 12:00 AM

## 2024-08-01 ENCOUNTER — Encounter (HOSPITAL_BASED_OUTPATIENT_CLINIC_OR_DEPARTMENT_OTHER): Payer: Self-pay | Admitting: Physical Therapy

## 2024-08-01 ENCOUNTER — Ambulatory Visit (HOSPITAL_BASED_OUTPATIENT_CLINIC_OR_DEPARTMENT_OTHER): Payer: Self-pay | Admitting: Physical Therapy

## 2024-08-01 DIAGNOSIS — M79604 Pain in right leg: Secondary | ICD-10-CM

## 2024-08-01 DIAGNOSIS — M5459 Other low back pain: Secondary | ICD-10-CM

## 2024-08-01 DIAGNOSIS — M6281 Muscle weakness (generalized): Secondary | ICD-10-CM

## 2024-08-01 NOTE — Therapy (Signed)
 OUTPATIENT PHYSICAL THERAPY THORACOLUMBAR TREATMENT   PROGRESS NOTE   Patient Name: Joan Mills MRN: [id number] DOB:1986/01/16, 38 y.o., female Today's Date: 08/01/2024  END OF SESSION:  PT End of Session - 08/01/24 1117     Visit Number 5    Number of Visits 16    Date for Recertification  09/09/24    PT Start Time 1114             Past Medical History:  Diagnosis Date   Breast mass, right 10/17/2014   Present since patient was age 46. Continues to be stable. U/S on 10/17/2014   Past Surgical History:  Procedure Laterality Date   ACHILLES TENDON SURGERY     CESAREAN SECTION     TUBAL LIGATION Bilateral 2006   Patient Active Problem List   Diagnosis Date Noted   Elevated serum creatinine 05/13/2019   Pap smear for cervical cancer screening 05/13/2019   Health care maintenance 10/28/2018     REFERRING PROVIDER: Tomlinson, Sara Caylin, PA-C  REFERRING DIAG: [phone] (ICD-10-CM) - Annular tear of lumbar disc / Other intervertebral disc degeneration, lumbar region without mention of lumbar back pain or lower extremity pain  Rationale for Evaluation and Treatment: Rehabilitation  THERAPY DIAG:  No diagnosis found.  ONSET DATE: 03/11/2024  SUBJECTIVE:                                                                                                                                                                                           SUBJECTIVE STATEMENT:  Pt states the exercises felt good.  She reports no change in pain after prior treatment.  Pt states she felt better after prior treatment.  Pt reports compliance with HEP.       PERTINENT HISTORY:  R Achilles tendon surgery  PAIN:  NPRS:  2/10 current, 6/10 worst 0/10 best Location:  central and R sided lumbar. No sx's in R LE in sitting currently.  Type:  dull, burning, numbness in R LE Aggravating factors:  unpredictable  Easing factors:  sitting still but only for so long   PRECAUTIONS:  None   WEIGHT BEARING RESTRICTIONS: No  FALLS:  Has patient fallen in last 6 months? No  LIVING ENVIRONMENT: Lives with:  domestic partner Lives in: 1 story apartment Stairs: no.  Small stoop to enter.   Has following equipment at home: None  OCCUPATION: Tailor  PLOF: Independent  PATIENT GOALS: reduce pain, to be able to perform cartwheels with niece, and to be able to walk my normal pace.     OBJECTIVE:  Note: Objective measures were completed at Evaluation unless otherwise noted.  DIAGNOSTIC FINDINGS:  MRI on 03/11/24: IMPRESSION: 1. L4-5 mild left asymmetric disc bulge with minimal narrowing of the left lateral recess. No spinal canal or neural foraminal stenosis. 2. L5-S1 small central disc protrusion with annular fissure. No spinal canal or neural foraminal stenosis.  PATIENT SURVEYS:  Modified Oswestry Disability Index:  11 = 22%  (Eval) 10 = 20%  (07/29/24)  COGNITION: Overall cognitive status: Within functional limits for tasks assessed     SENSATION: 2+ sensation to LT t/o bilat LE dermatomes   LUMBAR ROM:   AROM eval  Flexion WFL with R sided lumbar pain  Extension 75% with central lumbar pain  Right lateral flexion WFL, pain when returning to standing  Left lateral flexion WFL  Right rotation [facility]  Left rotation WFL   (Blank rows = not tested)  LOWER EXTREMITY STRENGTH:     MMT  Right eval Left eval Right 12/5  Hip flexion 5/5 5/5   Hip extension 4/5 5/5 4/5 with pain  Hip abduction 5/5 5/5   Hip adduction     Hip internal rotation     Hip external rotation 5/5 5/5   Knee flexion 4+/5 5/5 4+/5  Knee extension 4/5 5/5   Ankle dorsiflexion 5/5 5/5   Ankle plantarflexion WFL seated WFL seated   Ankle inversion     Ankle eversion      (Blank rows = not tested)  LOWER EXTREMITY ROM:    AROM Right eval Left eval  Hip flexion    Hip extension    Hip abduction [facility]  Hip adduction    Hip internal rotation    Hip external rotation     Knee flexion    Knee extension [facility]  Ankle dorsiflexion [facility]  Ankle plantarflexion [facility]  Ankle inversion    Ankle eversion     (Blank rows = not tested)        TREATMENT:                                                                                                                               08/01/24 Prone lying x 2 mins Prone prop up x 10 repetitions with approx 3-5 sec hold  Supine alt LE extension with TrA 2x10 Supine bridge with TrA 2x10 Supine clams with RTB with TrA 2x10  Pt received supine R manual HS stretch with passive AP's for neural flossing 3x30 sec  STM to bilat lumbar paraspinals in L S/L'ing with pillow b/w knees    07/29/24 Reviewed current function, pain levels, HEP compliance, and response to prior treatment.  Prone lying x 2 mins Prone prop up x 30 sec, 2x20 sec Prone prop up x 5 repetitions  PT assessed strength.  Pt had pain with hip extension MMT.  See above Prone lying x 1 min Prone prop up x30 sec  Pt received supine manual HS stretch with passive AP's for neural flossing 3x30 sec  LAQ with AP's for nn flossing 3 reps of 10 ankle pumps. Updated HEP with exercise and gave pt a handout.  PT educated pt in correct form, appropriate response, and appropriate frequency.   Pt completed Modified Oswestry.  See above   07/27/24  Reviewed current function, HEP compliance, pain level, and response to prior treatment.  Prone lying x 2 mins Prone lying x 2 mins with pillow under waist Prone prop up x 1 min  PPU approx 7 reps through a limited ROM.  Supine lumbar rotation x 10 bilat Supine alt LE extension with TrA approx 10 bilat Supine bridge with TrA Pt received supine manual HS stretch with passive AP's for neural flossing 3x30 sec  06/22/24  HS stretches 2x30 seconds B Piriformis stretches 2x30 seconds B  TA sets supine 10x3 seconds  PPT to lumbar arch 10x3 seconds each way Sciatic flossing 3x30 seconds Gastroc stretches  3x30 seconds supine R LE  Scap retractions from chair no resistance 10x3 seconds- had pain flare, had been arching back when doing this exercise  Prone press up to elbows x10, tolerable  Tried prone press up straight arms- painful, stopped      PATIENT EDUCATION:  Education details:  goal progress, relevant anatomy, dx, objective findings, POC, HEP, appropriate sx response, and rationale of interventions.   Person educated: Patient Education method: Explanation, Demonstration, Tactile cues, Verbal cues, and Handouts Education comprehension: verbalized understanding, returned demonstration, verbal cues required, tactile cues required, and needs further education  HOME EXERCISE PROGRAM:  Access Code: [email or web address] URL: [email or web address] Date: 06/22/2024 Prepared by: Josette Rough  Exercises - Supine Transversus Abdominis Bracing - Hands on Stomach  - 2 x daily - 7 x weekly - 2 sets - 10 reps - Prone Press Up On Elbows  - 1-2 x daily - 7 x weekly - 1-2 sets - 10 reps  Updated HEP: - Knee extension with Ankle Pumps  - 2 x daily - 7 x weekly - 2-3 reps    ASSESSMENT:  CLINICAL IMPRESSION:  Pt presents to treatment stating she felt better after prior treatment and the exercises helped.  She states the weather has her back flared up today.  Her worst pain has improved.  PT assessed R LE strength and she had no change compared to evaluation.  Pt had pain with hip extension strength testing.  PT performed Mckenzie exercises today.  She felt a burning sensation across her back though no radicular sx's in LE with prone lying.  Pt reports having R LE sx's in hooklying position and unable to tolerate hooklying position today.  Pt performed prone lying and prone prone prop up though still had R LE sx's in the hooklying position.  Pt states she felt good with neural mobilization techniques and exercises.  PT updated HEP with seated nerve flossing exercise.  Pt had no significant  change in self perceived disability with Modified Oswestry improving by 2%.  Pt met STG's #1,2.  She reports her pain being no worse after treatment.  Pt should benefit from cont skilled PT to address ongoing goals and impairments and to assist with restoring desired level of function.   Pt felt the burning in her back with prone lying and prone prop up.   STM felt good  Pain is less than when she came in       OBJECTIVE IMPAIRMENTS: decreased activity tolerance, decreased endurance, decreased mobility, difficulty walking, decreased ROM, decreased strength, increased fascial restrictions, impaired flexibility, and pain.  ACTIVITY LIMITATIONS: sitting, standing, and locomotion level  PARTICIPATION LIMITATIONS: cleaning, driving, shopping, and community activity  PERSONAL FACTORS:    REHAB POTENTIAL: Good  CLINICAL DECISION MAKING: Stable/uncomplicated  EVALUATION COMPLEXITY: Low   GOALS:   SHORT TERM GOALS: Target date: 07/07/2024   Pt will be independent and compliant with HEP for improved pain, strength, and function.  Baseline: Goal status: GOAL MET  12/5  2.  Pt will report at least a 25% improvement in pain and sx's overall.  Baseline:  Goal status: GOAL MET  12/5  3.  Pt will demo improved core strength as evidenced by performance and progression of exercises without adverse effects for improved tolerance with and performance of daily activities and functional mobility.   Baseline:  Goal status:  ONGOING     LONG TERM GOALS: Target date: 09/09/2024  Pt will report she is able to ambulate her normal community distance without increased pain.  Baseline:  Goal status: ONGOING  2.  Pt will report pain centralizing to her back in order for improved tolerance with ADLs/IADLs and functional mobility.  Baseline:  Goal status: NOT MET  3.  Pt will demo improved R LE strength to 5/5 MMT for improved functional mobility.   Baseline:  Goal status: NOT MET  4.  Pt  will demo proper form with squat to lift in order to perform functional lifting activities without bending for reduced stress on lumbar and decreased isk of injury. Baseline:  Goal status: ONGOING  5.  Pt will report at least a 60% improvement in standing tolerance for improved performance of IADLs.  Baseline:  Goal status: ONGOING    PLAN:  PT FREQUENCY: 2x/week  PT DURATION: 6 weeks  PLANNED INTERVENTIONS: 97164- PT Re-evaluation, 97750- Physical Performance Testing, 97110-Therapeutic exercises, 97530- Therapeutic activity, [id number]- Neuromuscular re-education, 97535- Self Care, [id number]- Manual therapy, [id number]- Gait training, [phone]- Aquatic Therapy, [phone]- Electrical stimulation (unattended), [phone]- Electrical stimulation (manual), [id number]- Ultrasound, 79439 (1-2 muscles), [id number] (3+ muscles)- Dry Needling, Patient/Family education, Taping, Joint mobilization, Spinal mobilization, Cryotherapy, and Moist heat.  PLAN FOR NEXT SESSION: Assess Mckenzie exercises again next visit.  Core strengthening.  STM to lumbar paraspinals.    Leigh Minerva III PT, DPT 08/01/24 11:18 AM

## 2024-08-03 ENCOUNTER — Ambulatory Visit (HOSPITAL_BASED_OUTPATIENT_CLINIC_OR_DEPARTMENT_OTHER): Payer: Self-pay | Admitting: Physical Therapy

## 2024-08-03 ENCOUNTER — Encounter (HOSPITAL_BASED_OUTPATIENT_CLINIC_OR_DEPARTMENT_OTHER): Payer: Self-pay | Admitting: Physical Therapy

## 2024-08-03 DIAGNOSIS — M6281 Muscle weakness (generalized): Secondary | ICD-10-CM

## 2024-08-03 DIAGNOSIS — M5459 Other low back pain: Secondary | ICD-10-CM

## 2024-08-03 DIAGNOSIS — M79604 Pain in right leg: Secondary | ICD-10-CM

## 2024-08-03 NOTE — Therapy (Signed)
 OUTPATIENT PHYSICAL THERAPY THORACOLUMBAR TREATMENT      Patient Name: Joan Mills MRN: [id number] DOB:03-25-1986, 38 y.o., female Today's Date: 08/04/2024  END OF SESSION:  PT End of Session - 08/03/24 1115     Visit Number 6    Number of Visits 16    Date for Recertification  09/09/24    PT Start Time 1111    PT Stop Time 1153    PT Time Calculation (min) 42 min    Activity Tolerance Patient tolerated treatment well    Behavior During Therapy [facility] for tasks assessed/performed             Past Medical History:  Diagnosis Date   Breast mass, right 10/17/2014   Present since patient was age 40. Continues to be stable. U/S on 10/17/2014   Past Surgical History:  Procedure Laterality Date   ACHILLES TENDON SURGERY     CESAREAN SECTION     TUBAL LIGATION Bilateral 2006   Patient Active Problem List   Diagnosis Date Noted   Elevated serum creatinine 05/13/2019   Pap smear for cervical cancer screening 05/13/2019   Health care maintenance 10/28/2018     REFERRING PROVIDER: Tomlinson, Sara Caylin, PA-C  REFERRING DIAG: [phone] (ICD-10-CM) - Annular tear of lumbar disc / Other intervertebral disc degeneration, lumbar region without mention of lumbar back pain or lower extremity pain  Rationale for Evaluation and Treatment: Rehabilitation  THERAPY DIAG:  Other low back pain  Pain in right leg  Muscle weakness (generalized)  ONSET DATE: 03/11/2024  SUBJECTIVE:                                                                                                                                                                                           SUBJECTIVE STATEMENT:  Pt states she feels good today.  Pt felt good after prior treatment.  She denies pain currently.  Pt reports compliance with HEP.       PERTINENT HISTORY:  R Achilles tendon surgery  PAIN:  NPRS:  0/10 current, 6/10 worst 0/10 best Location:  central and R sided lumbar. No sx's in R LE in  sitting currently.  Type:  dull, burning, numbness in R LE Aggravating factors:  unpredictable  Easing factors:  sitting still but only for so long   PRECAUTIONS: None   WEIGHT BEARING RESTRICTIONS: No  FALLS:  Has patient fallen in last 6 months? No  LIVING ENVIRONMENT: Lives with:  domestic partner Lives in: 1 story apartment Stairs: no.  Small stoop to enter.   Has following equipment at home: None  OCCUPATION: Tailor  PLOF: Independent  PATIENT  GOALS: reduce pain, to be able to perform cartwheels with niece, and to be able to walk my normal pace.     OBJECTIVE:  Note: Objective measures were completed at Evaluation unless otherwise noted.  DIAGNOSTIC FINDINGS:  MRI on 03/11/24: IMPRESSION: 1. L4-5 mild left asymmetric disc bulge with minimal narrowing of the left lateral recess. No spinal canal or neural foraminal stenosis. 2. L5-S1 small central disc protrusion with annular fissure. No spinal canal or neural foraminal stenosis.  PATIENT SURVEYS:  Modified Oswestry Disability Index:  11 = 22%  (Eval) 10 = 20%  (07/29/24)  COGNITION: Overall cognitive status: Within functional limits for tasks assessed     SENSATION: 2+ sensation to LT t/o bilat LE dermatomes   LUMBAR ROM:   AROM eval  Flexion WFL with R sided lumbar pain  Extension 75% with central lumbar pain  Right lateral flexion WFL, pain when returning to standing  Left lateral flexion WFL  Right rotation [facility]  Left rotation WFL   (Blank rows = not tested)  LOWER EXTREMITY STRENGTH:     MMT  Right eval Left eval Right 12/5  Hip flexion 5/5 5/5   Hip extension 4/5 5/5 4/5 with pain  Hip abduction 5/5 5/5   Hip adduction     Hip internal rotation     Hip external rotation 5/5 5/5   Knee flexion 4+/5 5/5 4+/5  Knee extension 4/5 5/5   Ankle dorsiflexion 5/5 5/5   Ankle plantarflexion WFL seated WFL seated   Ankle inversion     Ankle eversion      (Blank rows = not tested)  LOWER  EXTREMITY ROM:    AROM Right eval Left eval  Hip flexion    Hip extension    Hip abduction [facility]  Hip adduction    Hip internal rotation    Hip external rotation    Knee flexion    Knee extension [facility]  Ankle dorsiflexion [facility]  Ankle plantarflexion [facility]  Ankle inversion    Ankle eversion     (Blank rows = not tested)        TREATMENT:                                                                                                                               08/03/24 Prone lying 45 sec Prone prop up x 10 repetitions with approx 3-5 sec hold PPU through limited ROM 2x10 reps Seated LAQ with ankle pumps 2x10 bilat Supine alt LE extension with TrA 2x10 Supine bridge 2x10 Supine nn flossing with knee flex/ext AROM 2x10  STM to bilat lumbar paraspinals in L S/L'ing with pillow b/w knees   08/01/24 Prone lying x 2 mins Prone prop up x 10 repetitions with approx 3-5 sec hold  Supine alt LE extension with TrA 2x10 Supine bridge with TrA 2x10 Supine clams with RTB with TrA 2x10  Pt received supine R  manual HS stretch with passive AP's for neural flossing 3x30 sec  STM to bilat lumbar paraspinals in L S/L'ing with pillow b/w knees   07/29/24 Reviewed current function, pain levels, HEP compliance, and response to prior treatment.  Prone lying x 2 mins Prone prop up x 30 sec, 2x20 sec Prone prop up x 5 repetitions  PT assessed strength.  Pt had pain with hip extension MMT.  See above Prone lying x 1 min Prone prop up x30 sec  Pt received supine manual HS stretch with passive AP's for neural flossing 3x30 sec LAQ with AP's for nn flossing 3 reps of 10 ankle pumps. Updated HEP with exercise and gave pt a handout.  PT educated pt in correct form, appropriate response, and appropriate frequency.   Pt completed Modified Oswestry.  See above   07/27/24  Reviewed current function, HEP compliance, pain level, and response to prior treatment.  Prone lying x  2 mins Prone lying x 2 mins with pillow under waist Prone prop up x 1 min  PPU approx 7 reps through a limited ROM.  Supine lumbar rotation x 10 bilat Supine alt LE extension with TrA approx 10 bilat Supine bridge with TrA Pt received supine manual HS stretch with passive AP's for neural flossing 3x30 sec  06/22/24  HS stretches 2x30 seconds B Piriformis stretches 2x30 seconds B  TA sets supine 10x3 seconds  PPT to lumbar arch 10x3 seconds each way Sciatic flossing 3x30 seconds Gastroc stretches 3x30 seconds supine R LE  Scap retractions from chair no resistance 10x3 seconds- had pain flare, had been arching back when doing this exercise  Prone press up to elbows x10, tolerable  Tried prone press up straight arms- painful, stopped      PATIENT EDUCATION:  Education details:  relevant anatomy, dx, objective findings, POC, HEP, appropriate sx response, and rationale of interventions.   Person educated: Patient Education method: Explanation, Demonstration, Tactile cues, Verbal cues, and Handouts Education comprehension: verbalized understanding, returned demonstration, verbal cues required, tactile cues required, and needs further education  HOME EXERCISE PROGRAM:  Access Code: [email or web address] URL: [email or web address] Date: 06/22/2024 Prepared by: Josette Rough  Exercises - Supine Transversus Abdominis Bracing - Hands on Stomach  - 2 x daily - 7 x weekly - 2 sets - 10 reps - Prone Press Up On Elbows  - 1-2 x daily - 7 x weekly - 1-2 sets - 10 reps - Knee extension with Ankle Pumps  - 2 x daily - 7 x weekly - 2-3 reps    ASSESSMENT:  CLINICAL IMPRESSION:  Pt presents to treatment without any pain.  Pt seems to be making progress overall.  She is improving with performance and tolerance of Mckenzie exercises and states the exercises are helping.  Pt is unable to complete a full PPU though is performing through about 50% of the range.  PT progressed active nn  flossing exercise to supine.  Pt performed exercises well with cuing for correct form.  Pt states she feels a little aggravated, but has no pain after the exercises.  She has soft tissue tightness and tenderness in lumbar paraspinals and PT performed STM to bilat lumbar paraspinals to improve tightness and pain.  She should benefit from cont skilled PT to address ongoing goals and impairments and to assist with restoring desired level of function.         OBJECTIVE IMPAIRMENTS: decreased activity tolerance, decreased endurance, decreased mobility, difficulty walking, decreased ROM, decreased strength, increased fascial  restrictions, impaired flexibility, and pain.   ACTIVITY LIMITATIONS: sitting, standing, and locomotion level  PARTICIPATION LIMITATIONS: cleaning, driving, shopping, and community activity  PERSONAL FACTORS:    REHAB POTENTIAL: Good  CLINICAL DECISION MAKING: Stable/uncomplicated  EVALUATION COMPLEXITY: Low   GOALS:   SHORT TERM GOALS: Target date: 07/07/2024   Pt will be independent and compliant with HEP for improved pain, strength, and function.  Baseline: Goal status: GOAL MET  12/5  2.  Pt will report at least a 25% improvement in pain and sx's overall.  Baseline:  Goal status: GOAL MET  12/5  3.  Pt will demo improved core strength as evidenced by performance and progression of exercises without adverse effects for improved tolerance with and performance of daily activities and functional mobility.   Baseline:  Goal status:  ONGOING     LONG TERM GOALS: Target date: 09/09/2024  Pt will report she is able to ambulate her normal community distance without increased pain.  Baseline:  Goal status: ONGOING  2.  Pt will report pain centralizing to her back in order for improved tolerance with ADLs/IADLs and functional mobility.  Baseline:  Goal status: NOT MET  3.  Pt will demo improved R LE strength to 5/5 MMT for improved functional mobility.    Baseline:  Goal status: NOT MET  4.  Pt will demo proper form with squat to lift in order to perform functional lifting activities without bending for reduced stress on lumbar and decreased isk of injury. Baseline:  Goal status: ONGOING  5.  Pt will report at least a 60% improvement in standing tolerance for improved performance of IADLs.  Baseline:  Goal status: ONGOING    PLAN:  PT FREQUENCY: 2x/week  PT DURATION: 6 weeks  PLANNED INTERVENTIONS: 97164- PT Re-evaluation, 97750- Physical Performance Testing, 97110-Therapeutic exercises, 97530- Therapeutic activity, [id number]- Neuromuscular re-education, 97535- Self Care, 02859- Manual therapy, [id number]- Gait training, [phone]- Aquatic Therapy, [phone]- Electrical stimulation (unattended), [phone]- Electrical stimulation (manual), [id number]- Ultrasound, 79439 (1-2 muscles), 20561 (3+ muscles)- Dry Needling, Patient/Family education, Taping, Joint mobilization, Spinal mobilization, Cryotherapy, and Moist heat.  PLAN FOR NEXT SESSION: Assess Mckenzie exercises again next visit.  Core strengthening.  STM to lumbar paraspinals.    Leigh Minerva III PT, DPT 08/04/2024 10:36 PM

## 2024-08-08 ENCOUNTER — Ambulatory Visit (HOSPITAL_BASED_OUTPATIENT_CLINIC_OR_DEPARTMENT_OTHER): Payer: Self-pay | Admitting: Physical Therapy

## 2024-08-08 DIAGNOSIS — M79604 Pain in right leg: Secondary | ICD-10-CM

## 2024-08-08 DIAGNOSIS — M6281 Muscle weakness (generalized): Secondary | ICD-10-CM

## 2024-08-08 DIAGNOSIS — M5459 Other low back pain: Secondary | ICD-10-CM

## 2024-08-08 NOTE — Therapy (Signed)
 OUTPATIENT PHYSICAL THERAPY THORACOLUMBAR TREATMENT      Patient Name: Joan Mills MRN: [id number] DOB:1985-10-19, 38 y.o., female Today's Date: 08/08/2024  END OF SESSION:  PT End of Session - 08/08/24 1050     Visit Number 7    Number of Visits 16    Date for Recertification  09/09/24    PT Start Time 1050    PT Stop Time 1130    PT Time Calculation (min) 40 min    Activity Tolerance Patient tolerated treatment well    Behavior During Therapy [facility] for tasks assessed/performed              Past Medical History:  Diagnosis Date   Breast mass, right 10/17/2014   Present since patient was age 63. Continues to be stable. U/S on 10/17/2014   Past Surgical History:  Procedure Laterality Date   ACHILLES TENDON SURGERY     CESAREAN SECTION     TUBAL LIGATION Bilateral 2006   Patient Active Problem List   Diagnosis Date Noted   Elevated serum creatinine 05/13/2019   Pap smear for cervical cancer screening 05/13/2019   Health care maintenance 10/28/2018     REFERRING PROVIDER: Tomlinson, Sara Caylin, PA-C  REFERRING DIAG: [phone] (ICD-10-CM) - Annular tear of lumbar disc / Other intervertebral disc degeneration, lumbar region without mention of lumbar back pain or lower extremity pain  Rationale for Evaluation and Treatment: Rehabilitation  THERAPY DIAG:  Other low back pain  Pain in right leg  Muscle weakness (generalized)  ONSET DATE: 03/11/2024  SUBJECTIVE:                                                                                                                                                                                           SUBJECTIVE STATEMENT:  Pt states she feels good today.  Pt felt good after prior treatment.  She denies pain currently.  Pt reports compliance with HEP.       PERTINENT HISTORY:  R Achilles tendon surgery  PAIN:  NPRS:  0/10 current, 6/10 worst 0/10 best Location:  central and R sided lumbar. No sx's in R LE in  sitting currently.  Type:  dull, burning, numbness in R LE Aggravating factors:  unpredictable  Easing factors:  sitting still but only for so long   PRECAUTIONS: None   WEIGHT BEARING RESTRICTIONS: No  FALLS:  Has patient fallen in last 6 months? No  LIVING ENVIRONMENT: Lives with:  domestic partner Lives in: 1 story apartment Stairs: no.  Small stoop to enter.   Has following equipment at home: None  OCCUPATION: Tailor  PLOF: Independent  PATIENT GOALS: reduce pain, to be able to perform cartwheels with niece, and to be able to walk my normal pace.     OBJECTIVE:  Note: Objective measures were completed at Evaluation unless otherwise noted.  DIAGNOSTIC FINDINGS:  MRI on 03/11/24: IMPRESSION: 1. L4-5 mild left asymmetric disc bulge with minimal narrowing of the left lateral recess. No spinal canal or neural foraminal stenosis. 2. L5-S1 small central disc protrusion with annular fissure. No spinal canal or neural foraminal stenosis.  PATIENT SURVEYS:  Modified Oswestry Disability Index:  11 = 22%  (Eval) 10 = 20%  (07/29/24)  COGNITION: Overall cognitive status: Within functional limits for tasks assessed     SENSATION: 2+ sensation to LT t/o bilat LE dermatomes   LUMBAR ROM:   AROM eval  Flexion WFL with R sided lumbar pain  Extension 75% with central lumbar pain  Right lateral flexion WFL, pain when returning to standing  Left lateral flexion WFL  Right rotation [facility]  Left rotation WFL   (Blank rows = not tested)  LOWER EXTREMITY STRENGTH:     MMT  Right eval Left eval Right 12/5  Hip flexion 5/5 5/5   Hip extension 4/5 5/5 4/5 with pain  Hip abduction 5/5 5/5   Hip adduction     Hip internal rotation     Hip external rotation 5/5 5/5   Knee flexion 4+/5 5/5 4+/5  Knee extension 4/5 5/5   Ankle dorsiflexion 5/5 5/5   Ankle plantarflexion WFL seated WFL seated   Ankle inversion     Ankle eversion      (Blank rows = not tested)  LOWER  EXTREMITY ROM:    AROM Right eval Left eval  Hip flexion    Hip extension    Hip abduction [facility]  Hip adduction    Hip internal rotation    Hip external rotation    Knee flexion    Knee extension [facility]  Ankle dorsiflexion [facility]  Ankle plantarflexion [facility] WFL  Ankle inversion    Ankle eversion     (Blank rows = not tested)        TREATMENT:      08/08/24 STM to multifidi and rocking in lateral and longitudinal directions.  Attempted LTR with increased pain PPT with some relief  Supine nn flossing with knee flex/ext AROM 2x10 HS stretch with strap 2x30 sec  Nustep to help facilitate movement lvl 4.                                                                                                                             08/03/24 Prone lying 45 sec Prone prop up x 10 repetitions with approx 3-5 sec hold PPU through limited ROM 2x10 reps Seated LAQ with ankle pumps 2x10 bilat Supine alt LE extension with TrA 2x10 Supine bridge 2x10 Supine nn flossing with knee flex/ext AROM 2x10  STM to bilat lumbar paraspinals in L S/L'ing  with pillow b/w knees   08/01/24 Prone lying x 2 mins Prone prop up x 10 repetitions with approx 3-5 sec hold  Supine alt LE extension with TrA 2x10 Supine bridge with TrA 2x10 Supine clams with RTB with TrA 2x10  Pt received supine R manual HS stretch with passive AP's for neural flossing 3x30 sec  STM to bilat lumbar paraspinals in L S/L'ing with pillow b/w knees   07/29/24 Reviewed current function, pain levels, HEP compliance, and response to prior treatment.  Prone lying x 2 mins Prone prop up x 30 sec, 2x20 sec Prone prop up x 5 repetitions  PT assessed strength.  Pt had pain with hip extension MMT.  See above Prone lying x 1 min Prone prop up x30 sec  Pt received supine manual HS stretch with passive AP's for neural flossing 3x30 sec LAQ with AP's for nn flossing 3 reps of 10 ankle pumps. Updated HEP with exercise  and gave pt a handout.  PT educated pt in correct form, appropriate response, and appropriate frequency.   Pt completed Modified Oswestry.  See above   07/27/24  Reviewed current function, HEP compliance, pain level, and response to prior treatment.  Prone lying x 2 mins Prone lying x 2 mins with pillow under waist Prone prop up x 1 min  PPU approx 7 reps through a limited ROM.  Supine lumbar rotation x 10 bilat Supine alt LE extension with TrA approx 10 bilat Supine bridge with TrA Pt received supine manual HS stretch with passive AP's for neural flossing 3x30 sec  06/22/24  HS stretches 2x30 seconds B Piriformis stretches 2x30 seconds B  TA sets supine 10x3 seconds  PPT to lumbar arch 10x3 seconds each way Sciatic flossing 3x30 seconds Gastroc stretches 3x30 seconds supine R LE  Scap retractions from chair no resistance 10x3 seconds- had pain flare, had been arching back when doing this exercise  Prone press up to elbows x10, tolerable  Tried prone press up straight arms- painful, stopped      PATIENT EDUCATION:  Education details:  relevant anatomy, dx, objective findings, POC, HEP, appropriate sx response, and rationale of interventions.   Person educated: Patient Education method: Explanation, Demonstration, Tactile cues, Verbal cues, and Handouts Education comprehension: verbalized understanding, returned demonstration, verbal cues required, tactile cues required, and needs further education  HOME EXERCISE PROGRAM:  Access Code: [email or web address] URL: [email or web address] Date: 06/22/2024 Prepared by: Josette Rough  Exercises - Supine Transversus Abdominis Bracing - Hands on Stomach  - 2 x daily - 7 x weekly - 2 sets - 10 reps - Prone Press Up On Elbows  - 1-2 x daily - 7 x weekly - 1-2 sets - 10 reps - Knee extension with Ankle Pumps  - 2 x daily - 7 x weekly - 2-3 reps    ASSESSMENT:  CLINICAL IMPRESSION:  Pt states that she is having a lot more  lower back pain today due to the cold. She has been active with her HEP to help minimize onset of pain, but continues to feel tension in her multifidi on the R side. Pt tolerated session fairly with intermittent rest breaks required due to ongoing nerve pain. She was provided a tennis ball to help with self massage. Ended session on nustep for increased muscle movement with reports of feeling muscle working, but no pain. Pt will continue to benefit from skilled PT to address continued deficits.       OBJECTIVE IMPAIRMENTS: decreased activity tolerance, decreased  endurance, decreased mobility, difficulty walking, decreased ROM, decreased strength, increased fascial restrictions, impaired flexibility, and pain.   ACTIVITY LIMITATIONS: sitting, standing, and locomotion level  PARTICIPATION LIMITATIONS: cleaning, driving, shopping, and community activity  PERSONAL FACTORS:    REHAB POTENTIAL: Good  CLINICAL DECISION MAKING: Stable/uncomplicated  EVALUATION COMPLEXITY: Low   GOALS:   SHORT TERM GOALS: Target date: 07/07/2024   Pt will be independent and compliant with HEP for improved pain, strength, and function.  Baseline: Goal status: GOAL MET  12/5  2.  Pt will report at least a 25% improvement in pain and sx's overall.  Baseline:  Goal status: GOAL MET  12/5  3.  Pt will demo improved core strength as evidenced by performance and progression of exercises without adverse effects for improved tolerance with and performance of daily activities and functional mobility.   Baseline:  Goal status:  ONGOING     LONG TERM GOALS: Target date: 09/09/2024  Pt will report she is able to ambulate her normal community distance without increased pain.  Baseline:  Goal status: ONGOING  2.  Pt will report pain centralizing to her back in order for improved tolerance with ADLs/IADLs and functional mobility.  Baseline:  Goal status: NOT MET  3.  Pt will demo improved R LE strength to  5/5 MMT for improved functional mobility.   Baseline:  Goal status: NOT MET  4.  Pt will demo proper form with squat to lift in order to perform functional lifting activities without bending for reduced stress on lumbar and decreased isk of injury. Baseline:  Goal status: ONGOING  5.  Pt will report at least a 60% improvement in standing tolerance for improved performance of IADLs.  Baseline:  Goal status: ONGOING    PLAN:  PT FREQUENCY: 2x/week  PT DURATION: 6 weeks  PLANNED INTERVENTIONS: 97164- PT Re-evaluation, 97750- Physical Performance Testing, 97110-Therapeutic exercises, 97530- Therapeutic activity, [id number]- Neuromuscular re-education, 97535- Self Care, 02859- Manual therapy, [id number]- Gait training, [phone]- Aquatic Therapy, [phone]- Electrical stimulation (unattended), [phone]- Electrical stimulation (manual), [id number]- Ultrasound, 79439 (1-2 muscles), 20561 (3+ muscles)- Dry Needling, Patient/Family education, Taping, Joint mobilization, Spinal mobilization, Cryotherapy, and Moist heat.  PLAN FOR NEXT SESSION: Assess Mckenzie exercises again next visit.  Core strengthening.  STM to lumbar paraspinals.    Rojean Batten PT, DPT 08/08/2024  12:11 PM

## 2024-08-11 ENCOUNTER — Ambulatory Visit (HOSPITAL_BASED_OUTPATIENT_CLINIC_OR_DEPARTMENT_OTHER): Admitting: Physical Therapy

## 2024-08-11 DIAGNOSIS — M5459 Other low back pain: Secondary | ICD-10-CM

## 2024-08-11 DIAGNOSIS — M6281 Muscle weakness (generalized): Secondary | ICD-10-CM

## 2024-08-11 DIAGNOSIS — M79604 Pain in right leg: Secondary | ICD-10-CM

## 2024-08-11 NOTE — Therapy (Signed)
 OUTPATIENT PHYSICAL THERAPY THORACOLUMBAR TREATMENT      Patient Name: Joan Mills MRN: [id number] DOB:[birth year], 38 y.o., female Today's Date: 08/11/2024  END OF SESSION:  PT End of Session - 08/11/24 1035     Visit Number 8    Number of Visits 16    Date for Recertification  09/09/24    PT Start Time 1028    PT Stop Time 1110    PT Time Calculation (min) 42 min    Activity Tolerance Patient tolerated treatment well    Behavior During Therapy [facility] for tasks assessed/performed               Past Medical History:  Diagnosis Date   Breast mass, right 10/17/2014   Present since patient was age 77. Continues to be stable. U/S on 10/17/2014   Past Surgical History:  Procedure Laterality Date   ACHILLES TENDON SURGERY     CESAREAN SECTION     TUBAL LIGATION Bilateral 2006   Patient Active Problem List   Diagnosis Date Noted   Elevated serum creatinine 05/13/2019   Pap smear for cervical cancer screening 05/13/2019   Health care maintenance 10/28/2018     REFERRING PROVIDER: Tomlinson, Sara Caylin, PA-C  REFERRING DIAG: [phone] (ICD-10-CM) - Annular tear of lumbar disc / Other intervertebral disc degeneration, lumbar region without mention of lumbar back pain or lower extremity pain  Rationale for Evaluation and Treatment: Rehabilitation  THERAPY DIAG:  Other low back pain  Pain in right leg  Muscle weakness (generalized)  ONSET DATE: 03/11/2024  SUBJECTIVE:                                                                                                                                                                                           SUBJECTIVE STATEMENT:  Pt states she was doing fine until this AM.  Pt woke up with pain.  She had a 7/10 pain this AM.  Pt states she felt good after prior treatment.  Pt hasn't had as many spasms lately.  Pt is scheduled to have a lumbar injection on 09/05/24. Pt reports compliance with HEP.  Pt used the tennis ball  in supine at home for soft tissue mobilization.  Pt states it was a good painful.     PERTINENT HISTORY:  R Achilles tendon surgery  PAIN:  NPRS:  5-6/10 current, 6/10 worst 0/10 best Location:  central and R sided lumbar. No sx's in R LE in sitting currently.  Type:  dull, burning, numbness in R LE Aggravating factors:  unpredictable  Easing factors:  sitting still but only for so long   PRECAUTIONS:  None   WEIGHT BEARING RESTRICTIONS: No  FALLS:  Has patient fallen in last 6 months? No  LIVING ENVIRONMENT: Lives with:  domestic partner Lives in: 1 story apartment Stairs: no.  Small stoop to enter.   Has following equipment at home: None  OCCUPATION: Tailor  PLOF: Independent  PATIENT GOALS: reduce pain, to be able to perform cartwheels with niece, and to be able to walk my normal pace.     OBJECTIVE:  Note: Objective measures were completed at Evaluation unless otherwise noted.  DIAGNOSTIC FINDINGS:  MRI on 03/11/24: IMPRESSION: 1. L4-5 mild left asymmetric disc bulge with minimal narrowing of the left lateral recess. No spinal canal or neural foraminal stenosis. 2. L5-S1 small central disc protrusion with annular fissure. No spinal canal or neural foraminal stenosis.  PATIENT SURVEYS:  Modified Oswestry Disability Index:  11 = 22%  (Eval) 10 = 20%  (07/29/24)  COGNITION: Overall cognitive status: Within functional limits for tasks assessed     SENSATION: 2+ sensation to LT t/o bilat LE dermatomes   LUMBAR ROM:   AROM eval  Flexion WFL with R sided lumbar pain  Extension 75% with central lumbar pain  Right lateral flexion WFL, pain when returning to standing  Left lateral flexion WFL  Right rotation [facility]  Left rotation WFL   (Blank rows = not tested)  LOWER EXTREMITY STRENGTH:     MMT  Right eval Left eval Right 12/5  Hip flexion 5/5 5/5   Hip extension 4/5 5/5 4/5 with pain  Hip abduction 5/5 5/5   Hip adduction     Hip internal rotation      Hip external rotation 5/5 5/5   Knee flexion 4+/5 5/5 4+/5  Knee extension 4/5 5/5   Ankle dorsiflexion 5/5 5/5   Ankle plantarflexion WFL seated WFL seated   Ankle inversion     Ankle eversion      (Blank rows = not tested)  LOWER EXTREMITY ROM:    AROM Right eval Left eval  Hip flexion    Hip extension    Hip abduction [facility]  Hip adduction    Hip internal rotation    Hip external rotation    Knee flexion    Knee extension [facility]  Ankle dorsiflexion [facility]  Ankle plantarflexion [facility]  Ankle inversion    Ankle eversion     (Blank rows = not tested)        TREATMENT:      08/11/24 Prone lying 1 min 40 sec Prone prop up 3x20 sec PPU x 8 Supine lumbar rotation 2x10 Supine bridge with TrA 2x10 Attempted supine nn flossing with knee flexion/ext AROM though stopped due to pain PPT Pt received supine manual HS stretch with passive AP's for neural flossing 3x30 sec  Supine long axis LE distraction on R LE 3x30 sec STM to R sided lumbar paraspinals and QL in L S/L'ing with pillow b/w knees   08/08/24 STM to multifidi and rocking in lateral and longitudinal directions.  Attempted LTR with increased pain PPT with some relief  Supine nn flossing with knee flex/ext AROM 2x10 HS stretch with strap 2x30 sec  Nustep to help facilitate movement lvl 4.  08/03/24 Prone lying 45 sec Prone prop up x 10 repetitions with approx 3-5 sec hold PPU through limited ROM 2x10 reps Seated LAQ with ankle pumps 2x10 bilat Supine alt LE extension with TrA 2x10 Supine bridge 2x10 Supine nn flossing with knee flex/ext AROM 2x10  STM to bilat lumbar paraspinals in L S/L'ing with pillow b/w knees   08/01/24 Prone lying x 2 mins Prone prop up x 10 repetitions with approx 3-5 sec hold  Supine alt LE extension with TrA 2x10 Supine bridge with TrA  2x10 Supine clams with RTB with TrA 2x10  Pt received supine R manual HS stretch with passive AP's for neural flossing 3x30 sec  STM to bilat lumbar paraspinals in L S/L'ing with pillow b/w knees   07/29/24 Reviewed current function, pain levels, HEP compliance, and response to prior treatment.  Prone lying x 2 mins Prone prop up x 30 sec, 2x20 sec Prone prop up x 5 repetitions  PT assessed strength.  Pt had pain with hip extension MMT.  See above Prone lying x 1 min Prone prop up x30 sec  Pt received supine manual HS stretch with passive AP's for neural flossing 3x30 sec LAQ with AP's for nn flossing 3 reps of 10 ankle pumps. Updated HEP with exercise and gave pt a handout.  PT educated pt in correct form, appropriate response, and appropriate frequency.   Pt completed Modified Oswestry.  See above   07/27/24  Reviewed current function, HEP compliance, pain level, and response to prior treatment.  Prone lying x 2 mins Prone lying x 2 mins with pillow under waist Prone prop up x 1 min  PPU approx 7 reps through a limited ROM.  Supine lumbar rotation x 10 bilat Supine alt LE extension with TrA approx 10 bilat Supine bridge with TrA Pt received supine manual HS stretch with passive AP's for neural flossing 3x30 sec      PATIENT EDUCATION:  Education details:  relevant anatomy, dx, objective findings, POC, HEP, appropriate sx response, and rationale of interventions.   Person educated: Patient Education method: Explanation, Demonstration, Tactile cues, Verbal cues, and Handouts Education comprehension: verbalized understanding, returned demonstration, verbal cues required, tactile cues required, and needs further education  HOME EXERCISE PROGRAM:  Access Code: [email or web address] URL: [email or web address] Date: 06/22/2024 Prepared by: Josette Rough  Exercises - Supine Transversus Abdominis Bracing - Hands on Stomach  - 2 x daily - 7 x weekly - 2 sets - 10 reps -  Prone Press Up On Elbows  - 1-2 x daily - 7 x weekly - 1-2 sets - 10 reps - Knee extension with Ankle Pumps  - 2 x daily - 7 x weekly - 2-3 reps    ASSESSMENT:  CLINICAL IMPRESSION:  Pt presents to treatment stating she woke up with increased pain this AM.  She reports improvement overall including having reduced spasms lately.  PT had pt perform Mckenzie exercises.  Pt had back pain with prone lying though no LE pain.  Pt initially went through a larger range with PPU though range decreased as she increased her reps.  Pt states she feels a little better after the Prone exercises.  Attempted supine self nerve flossing with knee flexion/ext AROM though stopped due to pain.  Pt tolerated passive neural flossing with manual HS stretch well.  PT also performed supine LE distraction.  Pt had tenderness with soft tissue work to lumbar paraspinals.  She had increased soft tissue tightness and trigger points in  lumbar paraspinals.  She reports improved pain from 5-6/10 before treatment to 3/10 after treatment.      OBJECTIVE IMPAIRMENTS: decreased activity tolerance, decreased endurance, decreased mobility, difficulty walking, decreased ROM, decreased strength, increased fascial restrictions, impaired flexibility, and pain.   ACTIVITY LIMITATIONS: sitting, standing, and locomotion level  PARTICIPATION LIMITATIONS: cleaning, driving, shopping, and community activity  PERSONAL FACTORS:    REHAB POTENTIAL: Good  CLINICAL DECISION MAKING: Stable/uncomplicated  EVALUATION COMPLEXITY: Low   GOALS:   SHORT TERM GOALS: Target date: 07/07/2024   Pt will be independent and compliant with HEP for improved pain, strength, and function.  Baseline: Goal status: GOAL MET  12/5  2.  Pt will report at least a 25% improvement in pain and sx's overall.  Baseline:  Goal status: GOAL MET  12/5  3.  Pt will demo improved core strength as evidenced by performance and progression of exercises without  adverse effects for improved tolerance with and performance of daily activities and functional mobility.   Baseline:  Goal status:  ONGOING     LONG TERM GOALS: Target date: 09/09/2024  Pt will report she is able to ambulate her normal community distance without increased pain.  Baseline:  Goal status: ONGOING  2.  Pt will report pain centralizing to her back in order for improved tolerance with ADLs/IADLs and functional mobility.  Baseline:  Goal status: NOT MET  3.  Pt will demo improved R LE strength to 5/5 MMT for improved functional mobility.   Baseline:  Goal status: NOT MET  4.  Pt will demo proper form with squat to lift in order to perform functional lifting activities without bending for reduced stress on lumbar and decreased isk of injury. Baseline:  Goal status: ONGOING  5.  Pt will report at least a 60% improvement in standing tolerance for improved performance of IADLs.  Baseline:  Goal status: ONGOING    PLAN:  PT FREQUENCY: 2x/week  PT DURATION: 6 weeks  PLANNED INTERVENTIONS: [id number]- PT Re-evaluation, 97750- Physical Performance Testing, 97110-Therapeutic exercises, 97530- Therapeutic activity, [id number]- Neuromuscular re-education, 97535- Self Care, 02859- Manual therapy, [id number]- Gait training, [phone]- Aquatic Therapy, [phone]- Electrical stimulation (unattended), [phone]- Electrical stimulation (manual), [id number]- Ultrasound, 79439 (1-2 muscles), 20561 (3+ muscles)- Dry Needling, Patient/Family education, Taping, Joint mobilization, Spinal mobilization, Cryotherapy, and Moist heat.  PLAN FOR NEXT SESSION: Assess Mckenzie exercises again next visit.  Core strengthening.  STM to lumbar paraspinals.    Leigh Minerva III PT, DPT 08/11/2024 10:33 PM

## 2024-08-15 ENCOUNTER — Encounter (HOSPITAL_BASED_OUTPATIENT_CLINIC_OR_DEPARTMENT_OTHER): Admitting: Physical Therapy

## 2024-08-15 ENCOUNTER — Encounter (HOSPITAL_BASED_OUTPATIENT_CLINIC_OR_DEPARTMENT_OTHER): Payer: Self-pay | Admitting: Physical Therapy

## 2024-08-15 DIAGNOSIS — M5459 Other low back pain: Secondary | ICD-10-CM

## 2024-08-15 DIAGNOSIS — M6281 Muscle weakness (generalized): Secondary | ICD-10-CM

## 2024-08-15 DIAGNOSIS — M79604 Pain in right leg: Secondary | ICD-10-CM

## 2024-08-15 NOTE — Therapy (Signed)
 " OUTPATIENT PHYSICAL THERAPY THORACOLUMBAR TREATMENT      Patient Name: Joan Mills MRN: [id number] DOB:02-Oct-1985, 38 y.o., female Today's Date: 08/15/2024  END OF SESSION:  PT End of Session - 08/15/24 1312     Visit Number 9    Number of Visits 16    Date for Recertification  09/09/24    PT Start Time 1304    PT Stop Time 1342    PT Time Calculation (min) 38 min    Activity Tolerance Patient tolerated treatment well    Behavior During Therapy [facility] for tasks assessed/performed                Past Medical History:  Diagnosis Date   Breast mass, right 10/17/2014   Present since patient was age 51. Continues to be stable. U/S on 10/17/2014   Past Surgical History:  Procedure Laterality Date   ACHILLES TENDON SURGERY     CESAREAN SECTION     TUBAL LIGATION Bilateral 2006   Patient Active Problem List   Diagnosis Date Noted   Elevated serum creatinine 05/13/2019   Pap smear for cervical cancer screening 05/13/2019   Health care maintenance 10/28/2018     REFERRING PROVIDER: Tomlinson, Sara Caylin, PA-C  REFERRING DIAG: [phone] (ICD-10-CM) - Annular tear of lumbar disc / Other intervertebral disc degeneration, lumbar region without mention of lumbar back pain or lower extremity pain  Rationale for Evaluation and Treatment: Rehabilitation  THERAPY DIAG:  Other low back pain  Pain in right leg  Muscle weakness (generalized)  ONSET DATE: 03/11/2024  SUBJECTIVE:                                                                                                                                                                                           SUBJECTIVE STATEMENT:  It still has its days, but bad days are getting less bad overall. PT has been helpful so far     PERTINENT HISTORY:  R Achilles tendon surgery  PAIN:  NPRS:  2/10 Location:  R lumbar  Type:  dull, no numbness  Aggravating factors:  nothing  Easing factors:  sitting still but only  for so long, extension exercises and ball work in PT   PRECAUTIONS: None   WEIGHT BEARING RESTRICTIONS: No  FALLS:  Has patient fallen in last 6 months? No  LIVING ENVIRONMENT: Lives with:  domestic partner Lives in: 1 story apartment Stairs: no.  Small stoop to enter.   Has following equipment at home: None  OCCUPATION: Tailor  PLOF: Independent  PATIENT GOALS: reduce pain, to be able to perform cartwheels with niece, and to be  able to walk my normal pace.     OBJECTIVE:  Note: Objective measures were completed at Evaluation unless otherwise noted.  DIAGNOSTIC FINDINGS:  MRI on 03/11/24: IMPRESSION: 1. L4-5 mild left asymmetric disc bulge with minimal narrowing of the left lateral recess. No spinal canal or neural foraminal stenosis. 2. L5-S1 small central disc protrusion with annular fissure. No spinal canal or neural foraminal stenosis.  PATIENT SURVEYS:  Modified Oswestry Disability Index:  11 = 22%  (Eval) 10 = 20%  (07/29/24)  COGNITION: Overall cognitive status: Within functional limits for tasks assessed     SENSATION: 2+ sensation to LT t/o bilat LE dermatomes   LUMBAR ROM:   AROM eval  Flexion WFL with R sided lumbar pain  Extension 75% with central lumbar pain  Right lateral flexion WFL, pain when returning to standing  Left lateral flexion WFL  Right rotation [facility]  Left rotation WFL   (Blank rows = not tested)  LOWER EXTREMITY STRENGTH:     MMT  Right eval Left eval Right 12/5  Hip flexion 5/5 5/5   Hip extension 4/5 5/5 4/5 with pain  Hip abduction 5/5 5/5   Hip adduction     Hip internal rotation     Hip external rotation 5/5 5/5   Knee flexion 4+/5 5/5 4+/5  Knee extension 4/5 5/5   Ankle dorsiflexion 5/5 5/5   Ankle plantarflexion WFL seated WFL seated   Ankle inversion     Ankle eversion      (Blank rows = not tested)  LOWER EXTREMITY ROM:    AROM Right eval Left eval  Hip flexion    Hip extension    Hip abduction [facility]  [facility]  Hip adduction    Hip internal rotation    Hip external rotation    Knee flexion    Knee extension [facility]  Ankle dorsiflexion [facility]  Ankle plantarflexion [facility] WFL  Ankle inversion    Ankle eversion     (Blank rows = not tested)        TREATMENT:       08/15/24  Nustep L4x8 minutes seat 6 all four extremities   Standing lumbar extensions x12  HS stretches 2x30 seconds Sciatic flossing 2 rounds  Piriformis stretches 2x30 seconds B  Bridge + TA set + ABD into red TB x10 Sidelying clams red TB x10 B Prone laying on elbows x2 min QL stretch with stool 2x30 seconds   STM to upper right glute with tennis ball, L sidelying    08/11/24 Prone lying 1 min 40 sec Prone prop up 3x20 sec PPU x 8 Supine lumbar rotation 2x10 Supine bridge with TrA 2x10 Attempted supine nn flossing with knee flexion/ext AROM though stopped due to pain PPT Pt received supine manual HS stretch with passive AP's for neural flossing 3x30 sec  Supine long axis LE distraction on R LE 3x30 sec STM to R sided lumbar paraspinals and QL in L S/L'ing with pillow b/w knees   08/08/24 STM to multifidi and rocking in lateral and longitudinal directions.  Attempted LTR with increased pain PPT with some relief  Supine nn flossing with knee flex/ext AROM 2x10 HS stretch with strap 2x30 sec  Nustep to help facilitate movement lvl 4.  08/03/24 Prone lying 45 sec Prone prop up x 10 repetitions with approx 3-5 sec hold PPU through limited ROM 2x10 reps Seated LAQ with ankle pumps 2x10 bilat Supine alt LE extension with TrA 2x10 Supine bridge 2x10 Supine nn flossing with knee flex/ext AROM 2x10  STM to bilat lumbar paraspinals in L S/L'ing with pillow b/w knees   08/01/24 Prone lying x 2 mins Prone prop up x 10 repetitions with approx 3-5 sec hold  Supine alt LE  extension with TrA 2x10 Supine bridge with TrA 2x10 Supine clams with RTB with TrA 2x10  Pt received supine R manual HS stretch with passive AP's for neural flossing 3x30 sec  STM to bilat lumbar paraspinals in L S/L'ing with pillow b/w knees   07/29/24 Reviewed current function, pain levels, HEP compliance, and response to prior treatment.  Prone lying x 2 mins Prone prop up x 30 sec, 2x20 sec Prone prop up x 5 repetitions  PT assessed strength.  Pt had pain with hip extension MMT.  See above Prone lying x 1 min Prone prop up x30 sec  Pt received supine manual HS stretch with passive AP's for neural flossing 3x30 sec LAQ with AP's for nn flossing 3 reps of 10 ankle pumps. Updated HEP with exercise and gave pt a handout.  PT educated pt in correct form, appropriate response, and appropriate frequency.   Pt completed Modified Oswestry.  See above   07/27/24  Reviewed current function, HEP compliance, pain level, and response to prior treatment.  Prone lying x 2 mins Prone lying x 2 mins with pillow under waist Prone prop up x 1 min  PPU approx 7 reps through a limited ROM.  Supine lumbar rotation x 10 bilat Supine alt LE extension with TrA approx 10 bilat Supine bridge with TrA Pt received supine manual HS stretch with passive AP's for neural flossing 3x30 sec      PATIENT EDUCATION:  Education details:  relevant anatomy, dx, objective findings, POC, HEP, appropriate sx response, and rationale of interventions.   Person educated: Patient Education method: Explanation, Demonstration, Tactile cues, Verbal cues, and Handouts Education comprehension: verbalized understanding, returned demonstration, verbal cues required, tactile cues required, and needs further education  HOME EXERCISE PROGRAM:  Access Code: [email or web address] URL: [email or web address] Date: 06/22/2024 Prepared by: Josette Rough  Exercises - Supine Transversus Abdominis Bracing - Hands on Stomach   - 2 x daily - 7 x weekly - 2 sets - 10 reps - Prone Press Up On Elbows  - 1-2 x daily - 7 x weekly - 1-2 sets - 10 reps - Knee extension with Ankle Pumps  - 2 x daily - 7 x weekly - 2-3 reps    ASSESSMENT:  CLINICAL IMPRESSION:    Arrives today feeling well, her pain has improved quite a bit but is still present and tends to come and go. She will be getting a steroid shot in her back mid-January. We warmed up on the Nustep and then continued working on core strengthening as well as lumbar extension programming. Will continue to challenge her as able and tolerated.    OBJECTIVE IMPAIRMENTS: decreased activity tolerance, decreased endurance, decreased mobility, difficulty walking, decreased ROM, decreased strength, increased fascial restrictions, impaired flexibility, and pain.   ACTIVITY LIMITATIONS: sitting, standing, and locomotion level  PARTICIPATION LIMITATIONS: cleaning, driving, shopping, and community activity  PERSONAL FACTORS:    REHAB POTENTIAL: Good  CLINICAL DECISION MAKING: Stable/uncomplicated  EVALUATION COMPLEXITY: Low   GOALS:  SHORT TERM GOALS: Target date: 07/07/2024   Pt will be independent and compliant with HEP for improved pain, strength, and function.  Baseline: Goal status: GOAL MET  12/5  2.  Pt will report at least a 25% improvement in pain and sx's overall.  Baseline:  Goal status: GOAL MET  12/5  3.  Pt will demo improved core strength as evidenced by performance and progression of exercises without adverse effects for improved tolerance with and performance of daily activities and functional mobility.   Baseline:  Goal status:  ONGOING     LONG TERM GOALS: Target date: 09/09/2024  Pt will report she is able to ambulate her normal community distance without increased pain.  Baseline:  Goal status: ONGOING  2.  Pt will report pain centralizing to her back in order for improved tolerance with ADLs/IADLs and functional mobility.   Baseline:  Goal status: NOT MET  3.  Pt will demo improved R LE strength to 5/5 MMT for improved functional mobility.   Baseline:  Goal status: NOT MET  4.  Pt will demo proper form with squat to lift in order to perform functional lifting activities without bending for reduced stress on lumbar and decreased isk of injury. Baseline:  Goal status: ONGOING  5.  Pt will report at least a 60% improvement in standing tolerance for improved performance of IADLs.  Baseline:  Goal status: ONGOING    PLAN:  PT FREQUENCY: 2x/week  PT DURATION: 6 weeks  PLANNED INTERVENTIONS: 97164- PT Re-evaluation, 97750- Physical Performance Testing, 97110-Therapeutic exercises, 97530- Therapeutic activity, [id number]- Neuromuscular re-education, 97535- Self Care, [id number]- Manual therapy, [id number]- Gait training, [phone]- Aquatic Therapy, [phone]- Electrical stimulation (unattended), [phone]- Electrical stimulation (manual), [id number]- Ultrasound, 79439 (1-2 muscles), [id number] (3+ muscles)- Dry Needling, Patient/Family education, Taping, Joint mobilization, Spinal mobilization, Cryotherapy, and Moist heat.  PLAN FOR NEXT SESSION: Assess Mckenzie exercises again next visit.  Progress core strengthening.  STM to lumbar paraspinals.  Was she interested in dry needling?   Josette Rough, PT, DPT 08/15/2024 1:47 PM           "

## 2024-08-17 ENCOUNTER — Ambulatory Visit (HOSPITAL_BASED_OUTPATIENT_CLINIC_OR_DEPARTMENT_OTHER): Payer: Self-pay | Admitting: Physical Therapy

## 2024-08-17 ENCOUNTER — Encounter (HOSPITAL_BASED_OUTPATIENT_CLINIC_OR_DEPARTMENT_OTHER): Payer: Self-pay | Admitting: Physical Therapy

## 2024-08-17 DIAGNOSIS — M6281 Muscle weakness (generalized): Secondary | ICD-10-CM

## 2024-08-17 DIAGNOSIS — M79604 Pain in right leg: Secondary | ICD-10-CM

## 2024-08-17 DIAGNOSIS — M5459 Other low back pain: Secondary | ICD-10-CM

## 2024-08-17 NOTE — Therapy (Signed)
 " OUTPATIENT PHYSICAL THERAPY THORACOLUMBAR TREATMENT      Patient Name: Joan Mills MRN: [id number] DOB:07-27-1986, 38 y.o., female Today's Date: 08/17/2024  END OF SESSION:  PT End of Session - 08/17/24 1119     Visit Number 10    Number of Visits 16    Date for Recertification  09/09/24    PT Start Time 1112    PT Stop Time 1154    PT Time Calculation (min) 42 min    Activity Tolerance Patient tolerated treatment well    Behavior During Therapy [facility] for tasks assessed/performed                 Past Medical History:  Diagnosis Date   Breast mass, right 10/17/2014   Present since patient was age 59. Continues to be stable. U/S on 10/17/2014   Past Surgical History:  Procedure Laterality Date   ACHILLES TENDON SURGERY     CESAREAN SECTION     TUBAL LIGATION Bilateral 2006   Patient Active Problem List   Diagnosis Date Noted   Elevated serum creatinine 05/13/2019   Pap smear for cervical cancer screening 05/13/2019   Health care maintenance 10/28/2018     REFERRING PROVIDER: Tomlinson, Sara Caylin, PA-C  REFERRING DIAG: [phone] (ICD-10-CM) - Annular tear of lumbar disc / Other intervertebral disc degeneration, lumbar region without mention of lumbar back pain or lower extremity pain  Rationale for Evaluation and Treatment: Rehabilitation  THERAPY DIAG:  Other low back pain  Pain in right leg  Muscle weakness (generalized)  ONSET DATE: 03/11/2024  SUBJECTIVE:                                                                                                                                                                                           SUBJECTIVE STATEMENT:  Pt states she felt good after prior treatment.  Pt states the therapist worked the tightness out.  Pt still can feel the one spot on her R side.  Pt could feel a pull in her lower back when she is stretching overhead including putting up a shower curtain.       PERTINENT HISTORY:  R  Achilles tendon surgery  PAIN:  NPRS:  0/10 Location:  R lumbar and LE Type:  dull, no numbness  Aggravating factors:  nothing  Easing factors:  sitting still but only for so long, extension exercises and ball work in PT   PRECAUTIONS: None   WEIGHT BEARING RESTRICTIONS: No  FALLS:  Has patient fallen in last 6 months? No  LIVING ENVIRONMENT: Lives with:  domestic partner Lives in: 1 story apartment Stairs: no.  Small stoop to enter.   Has following equipment at home: None  OCCUPATION: Tailor  PLOF: Independent  PATIENT GOALS: reduce pain, to be able to perform cartwheels with niece, and to be able to walk my normal pace.     OBJECTIVE:  Note: Objective measures were completed at Evaluation unless otherwise noted.  DIAGNOSTIC FINDINGS:  MRI on 03/11/24: IMPRESSION: 1. L4-5 mild left asymmetric disc bulge with minimal narrowing of the left lateral recess. No spinal canal or neural foraminal stenosis. 2. L5-S1 small central disc protrusion with annular fissure. No spinal canal or neural foraminal stenosis.  PATIENT SURVEYS:  Modified Oswestry Disability Index:  11 = 22%  (Eval) 10 = 20%  (07/29/24)  COGNITION: Overall cognitive status: Within functional limits for tasks assessed     SENSATION: 2+ sensation to LT t/o bilat LE dermatomes   LUMBAR ROM:   AROM eval  Flexion WFL with R sided lumbar pain  Extension 75% with central lumbar pain  Right lateral flexion WFL, pain when returning to standing  Left lateral flexion WFL  Right rotation [facility]  Left rotation WFL   (Blank rows = not tested)  LOWER EXTREMITY STRENGTH:     MMT  Right eval Left eval Right 12/5  Hip flexion 5/5 5/5   Hip extension 4/5 5/5 4/5 with pain  Hip abduction 5/5 5/5   Hip adduction     Hip internal rotation     Hip external rotation 5/5 5/5   Knee flexion 4+/5 5/5 4+/5  Knee extension 4/5 5/5   Ankle dorsiflexion 5/5 5/5   Ankle plantarflexion WFL seated WFL seated   Ankle  inversion     Ankle eversion      (Blank rows = not tested)  LOWER EXTREMITY ROM:    AROM Right eval Left eval  Hip flexion    Hip extension    Hip abduction [facility]  Hip adduction    Hip internal rotation    Hip external rotation    Knee flexion    Knee extension [facility]  Ankle dorsiflexion [facility]  Ankle plantarflexion [facility] WFL  Ankle inversion    Ankle eversion     (Blank rows = not tested)        TREATMENT:       08/17/24 Nustep lvl 4 bilat UE/LE x 5 mins Prone prop up with intermittent prone lying x 2 mins Standing lumbar extensions x 10 reps Supine bridge with TrA with clams with RTB 2x10 Supine alt LE extension with TrA 2x10 while holding 1.1# ball Supine active neural flossing 2x10 Pt received supine manual HS stretch with passive AP's for neural flossing 3x30 sec  PT demonstrated the usage of theracane and instructed pt in using the theracane. STM to R sided lumbar paraspinals and QL in L S/L'ing with pillow b/w knees   08/15/24  Nustep L4x8 minutes seat 6 all four extremities   Standing lumbar extensions x12  HS stretches 2x30 seconds Sciatic flossing 2 rounds  Piriformis stretches 2x30 seconds B  Bridge + TA set + ABD into red TB x10 Sidelying clams red TB x10 B Prone laying on elbows x2 min QL stretch with stool 2x30 seconds   STM to upper right glute with tennis ball, L sidelying    08/11/24 Prone lying 1 min 40 sec Prone prop up 3x20 sec PPU x 8 Supine lumbar rotation 2x10 Supine bridge with TrA 2x10 Attempted supine nn flossing with knee flexion/ext AROM though stopped due  to pain PPT Pt received supine manual HS stretch with passive AP's for neural flossing 3x30 sec  Supine long axis LE distraction on R LE 3x30 sec STM to R sided lumbar paraspinals and QL in L S/L'ing with pillow b/w knees   08/08/24 STM to multifidi and rocking in lateral and longitudinal directions.  Attempted LTR with increased pain PPT with some relief   Supine nn flossing with knee flex/ext AROM 2x10 HS stretch with strap 2x30 sec  Nustep to help facilitate movement lvl 4.                                                                                                                             08/03/24 Prone lying 45 sec Prone prop up x 10 repetitions with approx 3-5 sec hold PPU through limited ROM 2x10 reps Seated LAQ with ankle pumps 2x10 bilat Supine alt LE extension with TrA 2x10 Supine bridge 2x10 Supine nn flossing with knee flex/ext AROM 2x10  STM to bilat lumbar paraspinals in L S/L'ing with pillow b/w knees   08/01/24 Prone lying x 2 mins Prone prop up x 10 repetitions with approx 3-5 sec hold  Supine alt LE extension with TrA 2x10 Supine bridge with TrA 2x10 Supine clams with RTB with TrA 2x10  Pt received supine R manual HS stretch with passive AP's for neural flossing 3x30 sec  STM to bilat lumbar paraspinals in L S/L'ing with pillow b/w knees   07/29/24 Reviewed current function, pain levels, HEP compliance, and response to prior treatment.  Prone lying x 2 mins Prone prop up x 30 sec, 2x20 sec Prone prop up x 5 repetitions  PT assessed strength.  Pt had pain with hip extension MMT.  See above Prone lying x 1 min Prone prop up x30 sec  Pt received supine manual HS stretch with passive AP's for neural flossing 3x30 sec LAQ with AP's for nn flossing 3 reps of 10 ankle pumps. Updated HEP with exercise and gave pt a handout.  PT educated pt in correct form, appropriate response, and appropriate frequency.   Pt completed Modified Oswestry.  See above   07/27/24  Reviewed current function, HEP compliance, pain level, and response to prior treatment.  Prone lying x 2 mins Prone lying x 2 mins with pillow under waist Prone prop up x 1 min  PPU approx 7 reps through a limited ROM.  Supine lumbar rotation x 10 bilat Supine alt LE extension with TrA approx 10 bilat Supine bridge with TrA Pt received  supine manual HS stretch with passive AP's for neural flossing 3x30 sec      PATIENT EDUCATION:  Education details:  relevant anatomy, dx, objective findings, POC, HEP, appropriate sx response, and rationale of interventions.   Person educated: Patient Education method: Explanation, Demonstration, Tactile cues, Verbal cues, and Handouts Education comprehension: verbalized understanding, returned demonstration, verbal cues required, tactile cues required, and needs further education  HOME EXERCISE  PROGRAM:  Access Code: [email or web address] URL: [email or web address] Date: 06/22/2024 Prepared by: Josette Rough  Exercises - Supine Transversus Abdominis Bracing - Hands on Stomach  - 2 x daily - 7 x weekly - 2 sets - 10 reps - Prone Press Up On Elbows  - 1-2 x daily - 7 x weekly - 1-2 sets - 10 reps - Knee extension with Ankle Pumps  - 2 x daily - 7 x weekly - 2-3 reps    ASSESSMENT:  CLINICAL IMPRESSION:   Pt is improving with pain and sx's and presents to treatment without any pain.  PT focused on core strengthening, Mckenzie exercises, neural mobility, and soft tissue work.  She is improving with exercise tolerance as evidenced by performance of exercises.  Pt unable to perform sustained prone prone prop up though did perform intermittently with prone lying for 2 mins.  Pt able to perform supine active nn flossing today.  Pt has a trigger point, tenderness, and soft tissue tightness in R sided lumbar paraspinals.  PT performed STM/TPR to R sided lumbar paraspinals and educated pt in using a theracane.  Pt used the theracane with instruction from PT.  Pt tolerated treatment well and had no increased pain after treatment.      OBJECTIVE IMPAIRMENTS: decreased activity tolerance, decreased endurance, decreased mobility, difficulty walking, decreased ROM, decreased strength, increased fascial restrictions, impaired flexibility, and pain.   ACTIVITY LIMITATIONS: sitting, standing, and  locomotion level  PARTICIPATION LIMITATIONS: cleaning, driving, shopping, and community activity  PERSONAL FACTORS:    REHAB POTENTIAL: Good  CLINICAL DECISION MAKING: Stable/uncomplicated  EVALUATION COMPLEXITY: Low   GOALS:   SHORT TERM GOALS: Target date: 07/07/2024   Pt will be independent and compliant with HEP for improved pain, strength, and function.  Baseline: Goal status: GOAL MET  12/5  2.  Pt will report at least a 25% improvement in pain and sx's overall.  Baseline:  Goal status: GOAL MET  12/5  3.  Pt will demo improved core strength as evidenced by performance and progression of exercises without adverse effects for improved tolerance with and performance of daily activities and functional mobility.   Baseline:  Goal status:  ONGOING     LONG TERM GOALS: Target date: 09/09/2024  Pt will report she is able to ambulate her normal community distance without increased pain.  Baseline:  Goal status: ONGOING  2.  Pt will report pain centralizing to her back in order for improved tolerance with ADLs/IADLs and functional mobility.  Baseline:  Goal status: NOT MET  3.  Pt will demo improved R LE strength to 5/5 MMT for improved functional mobility.   Baseline:  Goal status: NOT MET  4.  Pt will demo proper form with squat to lift in order to perform functional lifting activities without bending for reduced stress on lumbar and decreased isk of injury. Baseline:  Goal status: ONGOING  5.  Pt will report at least a 60% improvement in standing tolerance for improved performance of IADLs.  Baseline:  Goal status: ONGOING    PLAN:  PT FREQUENCY: 2x/week  PT DURATION: 6 weeks  PLANNED INTERVENTIONS: 97164- PT Re-evaluation, 97750- Physical Performance Testing, 97110-Therapeutic exercises, 97530- Therapeutic activity, [id number]- Neuromuscular re-education, 97535- Self Care, 02859- Manual therapy, [id number]- Gait training, [phone]- Aquatic Therapy, [phone]- Electrical  stimulation (unattended), [phone]- Electrical stimulation (manual), [id number]- Ultrasound, 79439 (1-2 muscles), 20561 (3+ muscles)- Dry Needling, Patient/Family education, Taping, Joint mobilization, Spinal mobilization, Cryotherapy, and Moist heat.  PLAN FOR NEXT SESSION: Cont  with Mckenzie exercises as tolerated.  Neural flossing techniques.  Progress core strengthening.  STM to lumbar paraspinals.    Leigh Minerva III PT, DPT 08/17/2024 1:42 PM            "

## 2024-08-20 NOTE — Therapy (Unsigned)
 " OUTPATIENT PHYSICAL THERAPY THORACOLUMBAR TREATMENT      Patient Name: Peityn Payton MRN: [id number] DOB:08-27-1985, 38 y.o., female Today's Date: 08/22/2024  END OF SESSION:  PT End of Session - 08/22/24 1515     Visit Number 11    Number of Visits 16    Date for Recertification  09/09/24    PT Start Time 0230    PT Stop Time 0317    PT Time Calculation (min) 47 min    Activity Tolerance Patient tolerated treatment well    Behavior During Therapy [facility] for tasks assessed/performed                  Past Medical History:  Diagnosis Date   Breast mass, right 10/17/2014   Present since patient was age 19. Continues to be stable. U/S on 10/17/2014   Past Surgical History:  Procedure Laterality Date   ACHILLES TENDON SURGERY     CESAREAN SECTION     TUBAL LIGATION Bilateral 2006   Patient Active Problem List   Diagnosis Date Noted   Elevated serum creatinine 05/13/2019   Pap smear for cervical cancer screening 05/13/2019   Health care maintenance 10/28/2018     REFERRING PROVIDER: Tomlinson, Sara Caylin, PA-C  REFERRING DIAG: [phone] (ICD-10-CM) - Annular tear of lumbar disc / Other intervertebral disc degeneration, lumbar region without mention of lumbar back pain or lower extremity pain  Rationale for Evaluation and Treatment: Rehabilitation  THERAPY DIAG:  Other low back pain  Pain in right leg  Muscle weakness (generalized)  ONSET DATE: 03/11/2024  SUBJECTIVE:                                                                                                                                                                                           SUBJECTIVE STATEMENT:  I had a lot of pain after last session.       PERTINENT HISTORY:  R Achilles tendon surgery  PAIN:  NPRS:  0/10 Location:  R lumbar and LE Type:  dull, no numbness  Aggravating factors:  nothing  Easing factors:  sitting still but only for so long, extension exercises and ball  work in PT   PRECAUTIONS: None   WEIGHT BEARING RESTRICTIONS: No  FALLS:  Has patient fallen in last 6 months? No  LIVING ENVIRONMENT: Lives with:  domestic partner Lives in: 1 story apartment Stairs: no.  Small stoop to enter.   Has following equipment at home: None  OCCUPATION: Tailor  PLOF: Independent  PATIENT GOALS: reduce pain, to be able to perform cartwheels with niece, and to be able to walk my normal  pace.     OBJECTIVE:  Note: Objective measures were completed at Evaluation unless otherwise noted.  DIAGNOSTIC FINDINGS:  MRI on 03/11/24: IMPRESSION: 1. L4-5 mild left asymmetric disc bulge with minimal narrowing of the left lateral recess. No spinal canal or neural foraminal stenosis. 2. L5-S1 small central disc protrusion with annular fissure. No spinal canal or neural foraminal stenosis.  PATIENT SURVEYS:  Modified Oswestry Disability Index:  11 = 22%  (Eval) 10 = 20%  (07/29/24)  COGNITION: Overall cognitive status: Within functional limits for tasks assessed     SENSATION: 2+ sensation to LT t/o bilat LE dermatomes   LUMBAR ROM:   AROM eval  Flexion WFL with R sided lumbar pain  Extension 75% with central lumbar pain  Right lateral flexion WFL, pain when returning to standing  Left lateral flexion WFL  Right rotation [facility]  Left rotation WFL   (Blank rows = not tested)  LOWER EXTREMITY STRENGTH:     MMT  Right eval Left eval Right 12/5  Hip flexion 5/5 5/5   Hip extension 4/5 5/5 4/5 with pain  Hip abduction 5/5 5/5   Hip adduction     Hip internal rotation     Hip external rotation 5/5 5/5   Knee flexion 4+/5 5/5 4+/5  Knee extension 4/5 5/5   Ankle dorsiflexion 5/5 5/5   Ankle plantarflexion WFL seated WFL seated   Ankle inversion     Ankle eversion      (Blank rows = not tested)  LOWER EXTREMITY ROM:    AROM Right eval Left eval  Hip flexion    Hip extension    Hip abduction [facility]  Hip adduction    Hip internal  rotation    Hip external rotation    Knee flexion    Knee extension [facility]  Ankle dorsiflexion [facility]  Ankle plantarflexion [facility] WFL  Ankle inversion    Ankle eversion     (Blank rows = not tested)        TREATMENT:      08/22/24 Nustep lvl 4 bilat UE/LE x 5 mins Lower trunk rotation with feet on red ball DKTC with heels on red ball.  HS stretch  IASTYM to R calf due to reports of pulling and tension when trying to perform a bridge or supine with bent knees.  Discussion about foot placement when she presses her foot on the pedal due to increased tension noted on her medial gastroc  Figure 4 stretch     08/17/24 Nustep lvl 4 bilat UE/LE x 5 mins Prone prop up with intermittent prone lying x 2 mins Standing lumbar extensions x 10 reps Supine bridge with TrA with clams with RTB 2x10 Supine alt LE extension with TrA 2x10 while holding 1.1# ball Supine active neural flossing 2x10 Pt received supine manual HS stretch with passive AP's for neural flossing 3x30 sec  PT demonstrated the usage of theracane and instructed pt in using the theracane. STM to R sided lumbar paraspinals and QL in L S/L'ing with pillow b/w knees   08/15/24  Nustep L4x8 minutes seat 6 all four extremities   Standing lumbar extensions x12  HS stretches 2x30 seconds Sciatic flossing 2 rounds  Piriformis stretches 2x30 seconds B  Bridge + TA set + ABD into red TB x10 Sidelying clams red TB x10 B Prone laying on elbows x2 min QL stretch with stool 2x30 seconds   STM to upper right glute with tennis ball, L  sidelying    08/11/24 Prone lying 1 min 40 sec Prone prop up 3x20 sec PPU x 8 Supine lumbar rotation 2x10 Supine bridge with TrA 2x10 Attempted supine nn flossing with knee flexion/ext AROM though stopped due to pain PPT Pt received supine manual HS stretch with passive AP's for neural flossing 3x30 sec  Supine long axis LE distraction on R LE 3x30 sec STM to R sided lumbar paraspinals  and QL in L S/L'ing with pillow b/w knees     PATIENT EDUCATION:  Education details:  relevant anatomy, dx, objective findings, POC, HEP, appropriate sx response, and rationale of interventions.   Person educated: Patient Education method: Explanation, Demonstration, Tactile cues, Verbal cues, and Handouts Education comprehension: verbalized understanding, returned demonstration, verbal cues required, tactile cues required, and needs further education  HOME EXERCISE PROGRAM:  Access Code: [email or web address] URL: [email or web address] Date: 06/22/2024 Prepared by: Josette Rough  Exercises - Supine Transversus Abdominis Bracing - Hands on Stomach  - 2 x daily - 7 x weekly - 2 sets - 10 reps - Prone Press Up On Elbows  - 1-2 x daily - 7 x weekly - 1-2 sets - 10 reps - Knee extension with Ankle Pumps  - 2 x daily - 7 x weekly - 2-3 reps    ASSESSMENT:  CLINICAL IMPRESSION:  Pt with increased tension in calf and HS when attempting to lay supine with knees bent. She reports a cramp and tension. Tried to perform HS stretch with minimal change and calf stretch with slight improvements. IASTM to calf with most tension noted at medial gastroc. Discussed foot placement when hitting pedal at work. Also discussed importance of heel to toe gait pattern due to patients tendency to walk on outside of R foot. She responded well to session with improvements in symptoms with re-test. She continues to depart with antalgic gait despite cues. Pt will continue to benefit from skilled PT to address continued deficits.    OBJECTIVE IMPAIRMENTS: decreased activity tolerance, decreased endurance, decreased mobility, difficulty walking, decreased ROM, decreased strength, increased fascial restrictions, impaired flexibility, and pain.   ACTIVITY LIMITATIONS: sitting, standing, and locomotion level  PARTICIPATION LIMITATIONS: cleaning, driving, shopping, and community activity  PERSONAL FACTORS:     REHAB POTENTIAL: Good  CLINICAL DECISION MAKING: Stable/uncomplicated  EVALUATION COMPLEXITY: Low   GOALS:   SHORT TERM GOALS: Target date: 07/07/2024   Pt will be independent and compliant with HEP for improved pain, strength, and function.  Baseline: Goal status: GOAL MET  12/5  2.  Pt will report at least a 25% improvement in pain and sx's overall.  Baseline:  Goal status: GOAL MET  12/5  3.  Pt will demo improved core strength as evidenced by performance and progression of exercises without adverse effects for improved tolerance with and performance of daily activities and functional mobility.   Baseline:  Goal status:  ONGOING     LONG TERM GOALS: Target date: 09/09/2024  Pt will report she is able to ambulate her normal community distance without increased pain.  Baseline:  Goal status: ONGOING  2.  Pt will report pain centralizing to her back in order for improved tolerance with ADLs/IADLs and functional mobility.  Baseline:  Goal status: NOT MET  3.  Pt will demo improved R LE strength to 5/5 MMT for improved functional mobility.   Baseline:  Goal status: NOT MET  4.  Pt will demo proper form with squat to lift in order to perform functional lifting activities without  bending for reduced stress on lumbar and decreased isk of injury. Baseline:  Goal status: ONGOING  5.  Pt will report at least a 60% improvement in standing tolerance for improved performance of IADLs.  Baseline:  Goal status: ONGOING    PLAN:  PT FREQUENCY: 2x/week  PT DURATION: 6 weeks  PLANNED INTERVENTIONS: [id number]- PT Re-evaluation, 97750- Physical Performance Testing, 97110-Therapeutic exercises, 97530- Therapeutic activity, [id number]- Neuromuscular re-education, 97535- Self Care, [id number]- Manual therapy, [id number]- Gait training, [phone]- Aquatic Therapy, [phone]- Electrical stimulation (unattended), [phone]- Electrical stimulation (manual), [id number]- Ultrasound, 79439 (1-2 muscles), [phone] (3+  muscles)- Dry Needling, Patient/Family education, Taping, Joint mobilization, Spinal mobilization, Cryotherapy, and Moist heat.  PLAN FOR NEXT SESSION: Cont with Mckenzie exercises as tolerated.  Neural flossing techniques.  Progress core strengthening.  STM to lumbar paraspinals.    Rojean Batten PT, DPT 08/22/2024  3:23 PM      "

## 2024-08-22 ENCOUNTER — Ambulatory Visit (HOSPITAL_BASED_OUTPATIENT_CLINIC_OR_DEPARTMENT_OTHER): Payer: Self-pay | Admitting: Physical Therapy

## 2024-08-22 ENCOUNTER — Encounter (HOSPITAL_BASED_OUTPATIENT_CLINIC_OR_DEPARTMENT_OTHER): Payer: Self-pay | Admitting: Physical Therapy

## 2024-08-22 DIAGNOSIS — M6281 Muscle weakness (generalized): Secondary | ICD-10-CM

## 2024-08-22 DIAGNOSIS — M5459 Other low back pain: Secondary | ICD-10-CM

## 2024-08-22 DIAGNOSIS — M79604 Pain in right leg: Secondary | ICD-10-CM

## 2024-08-24 ENCOUNTER — Encounter (HOSPITAL_BASED_OUTPATIENT_CLINIC_OR_DEPARTMENT_OTHER): Payer: Self-pay | Admitting: Physical Therapy

## 2024-08-24 ENCOUNTER — Ambulatory Visit (HOSPITAL_BASED_OUTPATIENT_CLINIC_OR_DEPARTMENT_OTHER): Payer: Self-pay | Admitting: Physical Therapy

## 2024-08-24 DIAGNOSIS — M5459 Other low back pain: Secondary | ICD-10-CM

## 2024-08-24 DIAGNOSIS — M6281 Muscle weakness (generalized): Secondary | ICD-10-CM

## 2024-08-24 DIAGNOSIS — M79604 Pain in right leg: Secondary | ICD-10-CM

## 2024-08-24 NOTE — Therapy (Signed)
 " OUTPATIENT PHYSICAL THERAPY THORACOLUMBAR TREATMENT      Patient Name: Joan Mills MRN: [id number] DOB:1986-01-13, 38 y.o., female Today's Date: 08/24/2024  END OF SESSION:  PT End of Session - 08/24/24 1120     Visit Number 12    Number of Visits 16    Date for Recertification  09/09/24    PT Start Time 1110    PT Stop Time 1153    PT Time Calculation (min) 43 min    Activity Tolerance Patient tolerated treatment well    Behavior During Therapy [facility] for tasks assessed/performed                   Past Medical History:  Diagnosis Date   Breast mass, right 10/17/2014   Present since patient was age 61. Continues to be stable. U/S on 10/17/2014   Past Surgical History:  Procedure Laterality Date   ACHILLES TENDON SURGERY     CESAREAN SECTION     TUBAL LIGATION Bilateral 2006   Patient Active Problem List   Diagnosis Date Noted   Elevated serum creatinine 05/13/2019   Pap smear for cervical cancer screening 05/13/2019   Health care maintenance 10/28/2018     REFERRING PROVIDER: Tomlinson, Sara Caylin, PA-C  REFERRING DIAG: [phone] (ICD-10-CM) - Annular tear of lumbar disc / Other intervertebral disc degeneration, lumbar region without mention of lumbar back pain or lower extremity pain  Rationale for Evaluation and Treatment: Rehabilitation  THERAPY DIAG:  Other low back pain  Pain in right leg  Muscle weakness (generalized)  ONSET DATE: 03/11/2024  SUBJECTIVE:                                                                                                                                                                                           SUBJECTIVE STATEMENT:  Pt reports having pain with soft tissue work to R calf last treatment and hurt afterwards.  Pt states the pain calmed down later that day.  Pt states her calf is still tender to touch.  Pt reports compliance with HEP.        PERTINENT HISTORY:  R Achilles tendon surgery  PAIN:   NPRS:  1/10 Location:  R lumbar  Type:  dull, no numbness  Aggravating factors:  nothing  Easing factors:  sitting still but only for so long, extension exercises and ball work in PT   PRECAUTIONS: None   WEIGHT BEARING RESTRICTIONS: No  FALLS:  Has patient fallen in last 6 months? No  LIVING ENVIRONMENT: Lives with:  domestic partner Lives in: 1 story apartment Stairs: no.  Small stoop to enter.  Has following equipment at home: None  OCCUPATION: Tailor  PLOF: Independent  PATIENT GOALS: reduce pain, to be able to perform cartwheels with niece, and to be able to walk my normal pace.     OBJECTIVE:  Note: Objective measures were completed at Evaluation unless otherwise noted.  DIAGNOSTIC FINDINGS:  MRI on 03/11/24: IMPRESSION: 1. L4-5 mild left asymmetric disc bulge with minimal narrowing of the left lateral recess. No spinal canal or neural foraminal stenosis. 2. L5-S1 small central disc protrusion with annular fissure. No spinal canal or neural foraminal stenosis.  PATIENT SURVEYS:  Modified Oswestry Disability Index:  11 = 22%  (Eval) 10 = 20%  (07/29/24)  COGNITION: Overall cognitive status: Within functional limits for tasks assessed     SENSATION: 2+ sensation to LT t/o bilat LE dermatomes   LUMBAR ROM:   AROM eval  Flexion WFL with R sided lumbar pain  Extension 75% with central lumbar pain  Right lateral flexion WFL, pain when returning to standing  Left lateral flexion WFL  Right rotation [facility]  Left rotation WFL   (Blank rows = not tested)  LOWER EXTREMITY STRENGTH:     MMT  Right eval Left eval Right 12/5  Hip flexion 5/5 5/5   Hip extension 4/5 5/5 4/5 with pain  Hip abduction 5/5 5/5   Hip adduction     Hip internal rotation     Hip external rotation 5/5 5/5   Knee flexion 4+/5 5/5 4+/5  Knee extension 4/5 5/5   Ankle dorsiflexion 5/5 5/5   Ankle plantarflexion WFL seated WFL seated   Ankle inversion     Ankle eversion       (Blank rows = not tested)  LOWER EXTREMITY ROM:    AROM Right eval Left eval  Hip flexion    Hip extension    Hip abduction [facility]  Hip adduction    Hip internal rotation    Hip external rotation    Knee flexion    Knee extension [facility]  Ankle dorsiflexion [facility]  Ankle plantarflexion [facility] WFL  Ankle inversion    Ankle eversion     (Blank rows = not tested)        TREATMENT:      08/24/24 Nustep lvl 4 bilat UE/LE x 5 mins Supine marching with TrA 2x10 Supine alt UE/LE x10 Supine alt LE extension with TrA 2x10 while holding 2#  Supine bridge with TrA with clams with RTB 2x10 Supine active neural flossing 2x10 PPU through limited range x 10 Prone prop up x 39 sec, 41 sec  Pt received supine manual HS stretch with passive AP's for neural flossing 2x30 sec on R LE Supine manual HS stretch L LE 2x30 sec  Grade II PA's to Lumbar SP's in prone    08/22/24 Nustep lvl 4 bilat UE/LE x 5 mins Lower trunk rotation with feet on red ball DKTC with heels on red ball.  HS stretch  IASTYM to R calf due to reports of pulling and tension when trying to perform a bridge or supine with bent knees.  Discussion about foot placement when she presses her foot on the pedal due to increased tension noted on her medial gastroc  Figure 4 stretch     08/17/24 Nustep lvl 4 bilat UE/LE x 5 mins Prone prop up with intermittent prone lying x 2 mins Standing lumbar extensions x 10 reps Supine bridge with TrA with clams with RTB 2x10 Supine alt LE extension  with TrA 2x10 while holding 1.1# ball Supine active neural flossing 2x10 Pt received supine manual HS stretch with passive AP's for neural flossing 3x30 sec  PT demonstrated the usage of theracane and instructed pt in using the theracane. STM to R sided lumbar paraspinals and QL in L S/L'ing with pillow b/w knees   08/15/24  Nustep L4x8 minutes seat 6 all four extremities   Standing lumbar extensions x12  HS stretches 2x30  seconds Sciatic flossing 2 rounds  Piriformis stretches 2x30 seconds B  Bridge + TA set + ABD into red TB x10 Sidelying clams red TB x10 B Prone laying on elbows x2 min QL stretch with stool 2x30 seconds   STM to upper right glute with tennis ball, L sidelying    08/11/24 Prone lying 1 min 40 sec Prone prop up 3x20 sec PPU x 8 Supine lumbar rotation 2x10 Supine bridge with TrA 2x10 Attempted supine nn flossing with knee flexion/ext AROM though stopped due to pain PPT Pt received supine manual HS stretch with passive AP's for neural flossing 3x30 sec  Supine long axis LE distraction on R LE 3x30 sec STM to R sided lumbar paraspinals and QL in L S/L'ing with pillow b/w knees     PATIENT EDUCATION:  Education details:  relevant anatomy, dx, objective findings, POC, HEP, appropriate sx response, and rationale of interventions.   Person educated: Patient Education method: Explanation, Demonstration, Tactile cues, Verbal cues, and Handouts Education comprehension: verbalized understanding, returned demonstration, verbal cues required, tactile cues required, and needs further education  HOME EXERCISE PROGRAM:  Access Code: [email or web address] URL: [email or web address] Date: 06/22/2024 Prepared by: Josette Rough  Exercises - Supine Transversus Abdominis Bracing - Hands on Stomach  - 2 x daily - 7 x weekly - 2 sets - 10 reps - Prone Press Up On Elbows  - 1-2 x daily - 7 x weekly - 1-2 sets - 10 reps - Knee extension with Ankle Pumps  - 2 x daily - 7 x weekly - 2-3 reps    ASSESSMENT:  CLINICAL IMPRESSION: Pt is making progress with pain and sx's.  Pt performed exercises to improve lumbopelvic stability.  She had some calf pain with core exercises though states it didn't feel like radicular or shooting pain.  Pt tolerated active DF well with supine active neural flossing to increase neural mobility.  Pt performed PPU though unable to perform through a full range.  PT  performed grade II lumbar PA's to improve lumbar mobility, stiffness, and pain.  She tolerated treatment well reporting no change in pain after treatment.   Pt should continue to benefit from skilled PT to address continued ongoing goals and impairments and improve overall function.    OBJECTIVE IMPAIRMENTS: decreased activity tolerance, decreased endurance, decreased mobility, difficulty walking, decreased ROM, decreased strength, increased fascial restrictions, impaired flexibility, and pain.   ACTIVITY LIMITATIONS: sitting, standing, and locomotion level  PARTICIPATION LIMITATIONS: cleaning, driving, shopping, and community activity  PERSONAL FACTORS:    REHAB POTENTIAL: Good  CLINICAL DECISION MAKING: Stable/uncomplicated  EVALUATION COMPLEXITY: Low   GOALS:   SHORT TERM GOALS: Target date: 07/07/2024   Pt will be independent and compliant with HEP for improved pain, strength, and function.  Baseline: Goal status: GOAL MET  12/5  2.  Pt will report at least a 25% improvement in pain and sx's overall.  Baseline:  Goal status: GOAL MET  12/5  3.  Pt will demo improved core strength as evidenced by performance and progression  of exercises without adverse effects for improved tolerance with and performance of daily activities and functional mobility.   Baseline:  Goal status:  ONGOING     LONG TERM GOALS: Target date: 09/09/2024  Pt will report she is able to ambulate her normal community distance without increased pain.  Baseline:  Goal status: ONGOING  2.  Pt will report pain centralizing to her back in order for improved tolerance with ADLs/IADLs and functional mobility.  Baseline:  Goal status: NOT MET  3.  Pt will demo improved R LE strength to 5/5 MMT for improved functional mobility.   Baseline:  Goal status: NOT MET  4.  Pt will demo proper form with squat to lift in order to perform functional lifting activities without bending for reduced stress on lumbar  and decreased isk of injury. Baseline:  Goal status: ONGOING  5.  Pt will report at least a 60% improvement in standing tolerance for improved performance of IADLs.  Baseline:  Goal status: ONGOING    PLAN:  PT FREQUENCY: 2x/week  PT DURATION: 6 weeks  PLANNED INTERVENTIONS: 97164- PT Re-evaluation, 97750- Physical Performance Testing, 97110-Therapeutic exercises, 97530- Therapeutic activity, [id number]- Neuromuscular re-education, 97535- Self Care, 02859- Manual therapy, [id number]- Gait training, [phone]- Aquatic Therapy, [phone]- Electrical stimulation (unattended), [phone]- Electrical stimulation (manual), [id number]- Ultrasound, 79439 (1-2 muscles), 20561 (3+ muscles)- Dry Needling, Patient/Family education, Taping, Joint mobilization, Spinal mobilization, Cryotherapy, and Moist heat.  PLAN FOR NEXT SESSION: Cont with Mckenzie exercises as tolerated.  Neural flossing techniques.  Progress core strengthening.  STM to lumbar paraspinals.    Leigh Minerva III PT, DPT 08/24/2024 9:34 PM       "

## 2024-08-29 ENCOUNTER — Encounter (HOSPITAL_BASED_OUTPATIENT_CLINIC_OR_DEPARTMENT_OTHER): Payer: Self-pay

## 2024-08-29 ENCOUNTER — Ambulatory Visit (HOSPITAL_BASED_OUTPATIENT_CLINIC_OR_DEPARTMENT_OTHER): Payer: Self-pay | Attending: Surgery

## 2024-08-29 DIAGNOSIS — M5459 Other low back pain: Secondary | ICD-10-CM | POA: Insufficient documentation

## 2024-08-29 DIAGNOSIS — M79604 Pain in right leg: Secondary | ICD-10-CM | POA: Insufficient documentation

## 2024-08-29 DIAGNOSIS — M6281 Muscle weakness (generalized): Secondary | ICD-10-CM | POA: Insufficient documentation

## 2024-08-29 NOTE — Therapy (Signed)
 " OUTPATIENT PHYSICAL THERAPY THORACOLUMBAR TREATMENT      Patient Name: Joan Mills MRN: [id number] DOB:05/27/1986, 39 y.o., female Today's Date: 08/29/2024  END OF SESSION:  PT End of Session - 08/29/24 1108     Visit Number 13    Number of Visits 16    Date for Recertification  09/09/24    PT Start Time 1101    PT Stop Time 1145    PT Time Calculation (min) 44 min    Activity Tolerance Patient tolerated treatment well    Behavior During Therapy [facility] for tasks assessed/performed                    Past Medical History:  Diagnosis Date   Breast mass, right 10/17/2014   Present since patient was age 88. Continues to be stable. U/S on 10/17/2014   Past Surgical History:  Procedure Laterality Date   ACHILLES TENDON SURGERY     CESAREAN SECTION     TUBAL LIGATION Bilateral 2006   Patient Active Problem List   Diagnosis Date Noted   Elevated serum creatinine 05/13/2019   Pap smear for cervical cancer screening 05/13/2019   Health care maintenance 10/28/2018     REFERRING PROVIDER: Tomlinson, Sara Caylin, PA-C  REFERRING DIAG: [phone] (ICD-10-CM) - Annular tear of lumbar disc / Other intervertebral disc degeneration, lumbar region without mention of lumbar back pain or lower extremity pain  Rationale for Evaluation and Treatment: Rehabilitation  THERAPY DIAG:  Other low back pain  Pain in right leg  Muscle weakness (generalized)  ONSET DATE: 03/11/2024  SUBJECTIVE:                                                                                                                                                                                           SUBJECTIVE STATEMENT:  Pt reports no c/o pain or numbness/tingling at entry. Did well after last session.      PERTINENT HISTORY:  R Achilles tendon surgery  PAIN:  NPRS:  0/10 Location:  R lumbar  Type:  dull, no numbness  Aggravating factors:  nothing  Easing factors:  sitting still but only for  so long, extension exercises and ball work in PT   PRECAUTIONS: None   WEIGHT BEARING RESTRICTIONS: No  FALLS:  Has patient fallen in last 6 months? No  LIVING ENVIRONMENT: Lives with:  domestic partner Lives in: 1 story apartment Stairs: no.  Small stoop to enter.   Has following equipment at home: None  OCCUPATION: Tailor  PLOF: Independent  PATIENT GOALS: reduce pain, to be able to perform cartwheels with niece, and to be  able to walk my normal pace.     OBJECTIVE:  Note: Objective measures were completed at Evaluation unless otherwise noted.  DIAGNOSTIC FINDINGS:  MRI on 03/11/24: IMPRESSION: 1. L4-5 mild left asymmetric disc bulge with minimal narrowing of the left lateral recess. No spinal canal or neural foraminal stenosis. 2. L5-S1 small central disc protrusion with annular fissure. No spinal canal or neural foraminal stenosis.  PATIENT SURVEYS:  Modified Oswestry Disability Index:  11 = 22%  (Eval) 10 = 20%  (07/29/24)  COGNITION: Overall cognitive status: Within functional limits for tasks assessed     SENSATION: 2+ sensation to LT t/o bilat LE dermatomes   LUMBAR ROM:   AROM eval  Flexion WFL with R sided lumbar pain  Extension 75% with central lumbar pain  Right lateral flexion WFL, pain when returning to standing  Left lateral flexion WFL  Right rotation [facility]  Left rotation WFL   (Blank rows = not tested)  LOWER EXTREMITY STRENGTH:     MMT  Right eval Left eval Right 12/5  Hip flexion 5/5 5/5   Hip extension 4/5 5/5 4/5 with pain  Hip abduction 5/5 5/5   Hip adduction     Hip internal rotation     Hip external rotation 5/5 5/5   Knee flexion 4+/5 5/5 4+/5  Knee extension 4/5 5/5   Ankle dorsiflexion 5/5 5/5   Ankle plantarflexion WFL seated WFL seated   Ankle inversion     Ankle eversion      (Blank rows = not tested)  LOWER EXTREMITY ROM:    AROM Right eval Left eval  Hip flexion    Hip extension    Hip abduction [facility]   Hip adduction    Hip internal rotation    Hip external rotation    Knee flexion    Knee extension [facility]  Ankle dorsiflexion [facility]  Ankle plantarflexion [facility] WFL  Ankle inversion    Ankle eversion     (Blank rows = not tested)        TREATMENT:       08/29/24 LTR 5 x10ea Supine HSS with strap Piriformis stretch 30sec x3bil S/l clam with RTB around thighs 2x15bil Bridge with clam RTB 2x10 Child's pose 15sec x5 Hooklying marching with TrA x20 Prone hip extension 2x10ea STS with cues for slow eccentric control x10 lowered plinth Nu-step L5 UE and LE     08/24/24 Nustep lvl 4 bilat UE/LE x 5 mins Supine marching with TrA 2x10 Supine alt UE/LE x10 Supine alt LE extension with TrA 2x10 while holding 2#  Supine bridge with TrA with clams with RTB 2x10 Supine active neural flossing 2x10 PPU through limited range x 10 Prone prop up x 39 sec, 41 sec  Pt received supine manual HS stretch with passive AP's for neural flossing 2x30 sec on R LE Supine manual HS stretch L LE 2x30 sec  Grade II PA's to Lumbar SP's in prone    08/22/24 Nustep lvl 4 bilat UE/LE x 5 mins Lower trunk rotation with feet on red ball DKTC with heels on red ball.  HS stretch  IASTYM to R calf due to reports of pulling and tension when trying to perform a bridge or supine with bent knees.  Discussion about foot placement when she presses her foot on the pedal due to increased tension noted on her medial gastroc  Figure 4 stretch     08/17/24 Nustep lvl 4 bilat UE/LE x 5  mins Prone prop up with intermittent prone lying x 2 mins Standing lumbar extensions x 10 reps Supine bridge with TrA with clams with RTB 2x10 Supine alt LE extension with TrA 2x10 while holding 1.1# ball Supine active neural flossing 2x10 Pt received supine manual HS stretch with passive AP's for neural flossing 3x30 sec  PT demonstrated the usage of theracane and instructed pt in using the theracane. STM to R  sided lumbar paraspinals and QL in L S/L'ing with pillow b/w knees   08/15/24  Nustep L4x8 minutes seat 6 all four extremities   Standing lumbar extensions x12  HS stretches 2x30 seconds Sciatic flossing 2 rounds  Piriformis stretches 2x30 seconds B  Bridge + TA set + ABD into red TB x10 Sidelying clams red TB x10 B Prone laying on elbows x2 min QL stretch with stool 2x30 seconds   STM to upper right glute with tennis ball, L sidelying    08/11/24 Prone lying 1 min 40 sec Prone prop up 3x20 sec PPU x 8 Supine lumbar rotation 2x10 Supine bridge with TrA 2x10 Attempted supine nn flossing with knee flexion/ext AROM though stopped due to pain PPT Pt received supine manual HS stretch with passive AP's for neural flossing 3x30 sec  Supine long axis LE distraction on R LE 3x30 sec STM to R sided lumbar paraspinals and QL in L S/L'ing with pillow b/w knees     PATIENT EDUCATION:  Education details:  relevant anatomy, dx, objective findings, POC, HEP, appropriate sx response, and rationale of interventions.   Person educated: Patient Education method: Explanation, Demonstration, Tactile cues, Verbal cues, and Handouts Education comprehension: verbalized understanding, returned demonstration, verbal cues required, tactile cues required, and needs further education  HOME EXERCISE PROGRAM:  Access Code: [email or web address] URL: [email or web address] Date: 06/22/2024 Prepared by: Josette Rough  Exercises - Supine Transversus Abdominis Bracing - Hands on Stomach  - 2 x daily - 7 x weekly - 2 sets - 10 reps - Prone Press Up On Elbows  - 1-2 x daily - 7 x weekly - 1-2 sets - 10 reps - Knee extension with Ankle Pumps  - 2 x daily - 7 x weekly - 2-3 reps    ASSESSMENT:  CLINICAL IMPRESSION: Focused on gentle stretching and NMC exercises today with good tolerance. Pt felt muscular fatigue with resisted clamshells as well as with bridges. Pt felt relief from child's pose  stretch which was performed after pt c/o increased tightness in back with exercises. Occasional onset of HS cramping in R LE only which was relieved by short rest. She felt her back wake up during the session, but denied significant discomfort. Educated on symptom management and activity modification. Pt will benefit from additional core and hip strengthening as well as NMC.   OBJECTIVE IMPAIRMENTS: decreased activity tolerance, decreased endurance, decreased mobility, difficulty walking, decreased ROM, decreased strength, increased fascial restrictions, impaired flexibility, and pain.   ACTIVITY LIMITATIONS: sitting, standing, and locomotion level  PARTICIPATION LIMITATIONS: cleaning, driving, shopping, and community activity  PERSONAL FACTORS:    REHAB POTENTIAL: Good  CLINICAL DECISION MAKING: Stable/uncomplicated  EVALUATION COMPLEXITY: Low   GOALS:   SHORT TERM GOALS: Target date: 07/07/2024   Pt will be independent and compliant with HEP for improved pain, strength, and function.  Baseline: Goal status: GOAL MET  12/5  2.  Pt will report at least a 25% improvement in pain and sx's overall.  Baseline:  Goal status: GOAL MET  12/5  3.  Pt will  demo improved core strength as evidenced by performance and progression of exercises without adverse effects for improved tolerance with and performance of daily activities and functional mobility.   Baseline:  Goal status:  ONGOING     LONG TERM GOALS: Target date: 09/09/2024  Pt will report she is able to ambulate her normal community distance without increased pain.  Baseline:  Goal status: ONGOING  2.  Pt will report pain centralizing to her back in order for improved tolerance with ADLs/IADLs and functional mobility.  Baseline:  Goal status: NOT MET  3.  Pt will demo improved R LE strength to 5/5 MMT for improved functional mobility.   Baseline:  Goal status: NOT MET  4.  Pt will demo proper form with squat to lift  in order to perform functional lifting activities without bending for reduced stress on lumbar and decreased isk of injury. Baseline:  Goal status: ONGOING  5.  Pt will report at least a 60% improvement in standing tolerance for improved performance of IADLs.  Baseline:  Goal status: ONGOING    PLAN:  PT FREQUENCY: 2x/week  PT DURATION: 6 weeks  PLANNED INTERVENTIONS: 97164- PT Re-evaluation, 97750- Physical Performance Testing, 97110-Therapeutic exercises, 97530- Therapeutic activity, [id number]- Neuromuscular re-education, 97535- Self Care, [id number]- Manual therapy, [id number]- Gait training, [phone]- Aquatic Therapy, [phone]- Electrical stimulation (unattended), [phone]- Electrical stimulation (manual), [id number]- Ultrasound, 79439 (1-2 muscles), 20561 (3+ muscles)- Dry Needling, Patient/Family education, Taping, Joint mobilization, Spinal mobilization, Cryotherapy, and Moist heat.  PLAN FOR NEXT SESSION: Cont with Mckenzie exercises as tolerated.  Neural flossing techniques.  Progress core strengthening.  STM to lumbar paraspinals.    Asberry Rodes, PTA  08/29/2024 1:19 PM       "

## 2024-09-05 ENCOUNTER — Ambulatory Visit (HOSPITAL_BASED_OUTPATIENT_CLINIC_OR_DEPARTMENT_OTHER): Payer: Self-pay

## 2024-09-05 ENCOUNTER — Encounter (HOSPITAL_BASED_OUTPATIENT_CLINIC_OR_DEPARTMENT_OTHER): Payer: Self-pay

## 2024-09-05 DIAGNOSIS — M5459 Other low back pain: Secondary | ICD-10-CM

## 2024-09-05 DIAGNOSIS — M79604 Pain in right leg: Secondary | ICD-10-CM

## 2024-09-05 DIAGNOSIS — M6281 Muscle weakness (generalized): Secondary | ICD-10-CM

## 2024-09-05 NOTE — Therapy (Signed)
 " OUTPATIENT PHYSICAL THERAPY THORACOLUMBAR TREATMENT      Patient Name: Joan Mills MRN: [id number] DOB:[birth year], 39 y.o., female Today's Date: 09/05/2024  END OF SESSION:  PT End of Session - 09/05/24 1111     Visit Number 14    Number of Visits 16    Date for Recertification  09/09/24    PT Start Time 1102    PT Stop Time 1145    PT Time Calculation (min) 43 min    Activity Tolerance Patient tolerated treatment well    Behavior During Therapy [facility] for tasks assessed/performed                     Past Medical History:  Diagnosis Date   Breast mass, right 10/17/2014   Present since patient was age 28. Continues to be stable. U/S on 10/17/2014   Past Surgical History:  Procedure Laterality Date   ACHILLES TENDON SURGERY     CESAREAN SECTION     TUBAL LIGATION Bilateral 2006   Patient Active Problem List   Diagnosis Date Noted   Elevated serum creatinine 05/13/2019   Pap smear for cervical cancer screening 05/13/2019   Health care maintenance 10/28/2018     REFERRING PROVIDER: Tomlinson, Sara Caylin, PA-C  REFERRING DIAG: [phone] (ICD-10-CM) - Annular tear of lumbar disc / Other intervertebral disc degeneration, lumbar region without mention of lumbar back pain or lower extremity pain  Rationale for Evaluation and Treatment: Rehabilitation  THERAPY DIAG:  Other low back pain  Muscle weakness (generalized)  Pain in right leg  ONSET DATE: 03/11/2024  SUBJECTIVE:                                                                                                                                                                                           SUBJECTIVE STATEMENT:  Pt reports her back is doing good today, it was sore this weekend after a trip to [place]. No recent c/o numbness or tingling. Only mild soreness after last PT session.      PERTINENT HISTORY:  R Achilles tendon surgery  PAIN:  NPRS:  0/10 Location:  R lumbar  Type:  dull,  no numbness  Aggravating factors:  nothing  Easing factors:  sitting still but only for so long, extension exercises and ball work in PT   PRECAUTIONS: None   WEIGHT BEARING RESTRICTIONS: No  FALLS:  Has patient fallen in last 6 months? No  LIVING ENVIRONMENT: Lives with:  domestic partner Lives in: 1 story apartment Stairs: no.  Small stoop to enter.   Has following equipment at home: None  OCCUPATION: Tailor  PLOF: Independent  PATIENT GOALS: reduce pain, to be able to perform cartwheels with niece, and to be able to walk my normal pace.     OBJECTIVE:  Note: Objective measures were completed at Evaluation unless otherwise noted.  DIAGNOSTIC FINDINGS:  MRI on 03/11/24: IMPRESSION: 1. L4-5 mild left asymmetric disc bulge with minimal narrowing of the left lateral recess. No spinal canal or neural foraminal stenosis. 2. L5-S1 small central disc protrusion with annular fissure. No spinal canal or neural foraminal stenosis.  PATIENT SURVEYS:  Modified Oswestry Disability Index:  11 = 22%  (Eval) 10 = 20%  (07/29/24)  COGNITION: Overall cognitive status: Within functional limits for tasks assessed     SENSATION: 2+ sensation to LT t/o bilat LE dermatomes   LUMBAR ROM:   AROM eval  Flexion WFL with R sided lumbar pain  Extension 75% with central lumbar pain  Right lateral flexion WFL, pain when returning to standing  Left lateral flexion WFL  Right rotation [facility]  Left rotation WFL   (Blank rows = not tested)  LOWER EXTREMITY STRENGTH:     MMT  Right eval Left eval Right 12/5  Hip flexion 5/5 5/5   Hip extension 4/5 5/5 4/5 with pain  Hip abduction 5/5 5/5   Hip adduction     Hip internal rotation     Hip external rotation 5/5 5/5   Knee flexion 4+/5 5/5 4+/5  Knee extension 4/5 5/5   Ankle dorsiflexion 5/5 5/5   Ankle plantarflexion WFL seated WFL seated   Ankle inversion     Ankle eversion      (Blank rows = not tested)  LOWER EXTREMITY ROM:     AROM Right eval Left eval  Hip flexion    Hip extension    Hip abduction [facility]  Hip adduction    Hip internal rotation    Hip external rotation    Knee flexion    Knee extension [facility]  Ankle dorsiflexion [facility]  Ankle plantarflexion [facility] WFL  Ankle inversion    Ankle eversion     (Blank rows = not tested)        TREATMENT:       09/05/24 LTR 5 x10ea Supine HSS with strap 30seconds x3ea Piriformis stretch 30sec x3bil S/l clam with RTB around thighs 3x10bil Bridge with clam RTB 2x10 Child's pose 30sec x3 Hooklying marching with TrA x20 Prone hip extension 2x10ea STS with cues for slow eccentric control 2x10 lowered plinth Nu-step L5 UE and LE HEP update and review    08/29/24 LTR 5 x10ea Supine HSS with strap Piriformis stretch 30sec x3bil S/l clam with RTB around thighs 2x15bil Bridge with clam RTB 2x10 Child's pose 15sec x5 Hooklying marching with TrA x20 Prone hip extension 2x10ea STS with cues for slow eccentric control x10 lowered plinth Nu-step L5 UE and LE     08/24/24 Nustep lvl 4 bilat UE/LE x 5 mins Supine marching with TrA 2x10 Supine alt UE/LE x10 Supine alt LE extension with TrA 2x10 while holding 2#  Supine bridge with TrA with clams with RTB 2x10 Supine active neural flossing 2x10 PPU through limited range x 10 Prone prop up x 39 sec, 41 sec  Pt received supine manual HS stretch with passive AP's for neural flossing 2x30 sec on R LE Supine manual HS stretch L LE 2x30 sec  Grade II PA's to Lumbar SP's in prone     PATIENT EDUCATION:  Education details:  relevant anatomy, dx, objective findings, POC, HEP, appropriate sx response, and rationale of interventions.   Person educated: Patient Education method: Explanation, Demonstration, Tactile cues, Verbal cues, and Handouts Education comprehension: verbalized understanding, returned demonstration, verbal cues required, tactile cues required, and needs further  education  HOME EXERCISE PROGRAM:  Access Code: [email or web address] URL: [email or web address] Date: 06/22/2024 Prepared by: Josette Rough   ASSESSMENT:  CLINICAL IMPRESSION: Continued with targeted stretching and strengthening program with good tolerance. NMC exercises performed to improve activation of TrA and gluteal mm. Pt did remain fatigued with s/l clams using RTB for resistance. She denied any pain or radiating sx throughout session. Updated HEP and reviewed with her. Will continue to progress as tolerated.    OBJECTIVE IMPAIRMENTS: decreased activity tolerance, decreased endurance, decreased mobility, difficulty walking, decreased ROM, decreased strength, increased fascial restrictions, impaired flexibility, and pain.   ACTIVITY LIMITATIONS: sitting, standing, and locomotion level  PARTICIPATION LIMITATIONS: cleaning, driving, shopping, and community activity  PERSONAL FACTORS:    REHAB POTENTIAL: Good  CLINICAL DECISION MAKING: Stable/uncomplicated  EVALUATION COMPLEXITY: Low   GOALS:   SHORT TERM GOALS: Target date: 07/07/2024   Pt will be independent and compliant with HEP for improved pain, strength, and function.  Baseline: Goal status: GOAL MET  12/5  2.  Pt will report at least a 25% improvement in pain and sx's overall.  Baseline:  Goal status: GOAL MET  12/5  3.  Pt will demo improved core strength as evidenced by performance and progression of exercises without adverse effects for improved tolerance with and performance of daily activities and functional mobility.   Baseline:  Goal status:  ONGOING     LONG TERM GOALS: Target date: 09/09/2024  Pt will report she is able to ambulate her normal community distance without increased pain.  Baseline:  Goal status: ONGOING  2.  Pt will report pain centralizing to her back in order for improved tolerance with ADLs/IADLs and functional mobility.  Baseline:  Goal status: NOT MET  3.  Pt will  demo improved R LE strength to 5/5 MMT for improved functional mobility.   Baseline:  Goal status: NOT MET  4.  Pt will demo proper form with squat to lift in order to perform functional lifting activities without bending for reduced stress on lumbar and decreased isk of injury. Baseline:  Goal status: ONGOING  5.  Pt will report at least a 60% improvement in standing tolerance for improved performance of IADLs.  Baseline:  Goal status: ONGOING    PLAN:  PT FREQUENCY: 2x/week  PT DURATION: 6 weeks  PLANNED INTERVENTIONS: 97164- PT Re-evaluation, 97750- Physical Performance Testing, 97110-Therapeutic exercises, 97530- Therapeutic activity, [id number]- Neuromuscular re-education, 97535- Self Care, 02859- Manual therapy, [id number]- Gait training, [phone]- Aquatic Therapy, [phone]- Electrical stimulation (unattended), [phone]- Electrical stimulation (manual), [id number]- Ultrasound, 79439 (1-2 muscles), 20561 (3+ muscles)- Dry Needling, Patient/Family education, Taping, Joint mobilization, Spinal mobilization, Cryotherapy, and Moist heat.  PLAN FOR NEXT SESSION: Cont with Mckenzie exercises as tolerated.  Neural flossing techniques.  Progress core strengthening.  STM to lumbar paraspinals.    Asberry Rodes, PTA  09/05/2024 12:09 PM       "

## 2024-09-08 ENCOUNTER — Encounter (HOSPITAL_BASED_OUTPATIENT_CLINIC_OR_DEPARTMENT_OTHER): Payer: Self-pay | Admitting: Physical Therapy

## 2024-09-08 ENCOUNTER — Ambulatory Visit (HOSPITAL_BASED_OUTPATIENT_CLINIC_OR_DEPARTMENT_OTHER): Payer: Self-pay | Admitting: Physical Therapy

## 2024-09-08 DIAGNOSIS — M79604 Pain in right leg: Secondary | ICD-10-CM

## 2024-09-08 DIAGNOSIS — M6281 Muscle weakness (generalized): Secondary | ICD-10-CM

## 2024-09-08 DIAGNOSIS — M5459 Other low back pain: Secondary | ICD-10-CM

## 2024-09-08 NOTE — Therapy (Signed)
 " OUTPATIENT PHYSICAL THERAPY THORACOLUMBAR TREATMENT      Patient Name: Joan Mills MRN: [id number] DOB:[birth year], 39 y.o., female Today's Date: 09/09/2024  END OF SESSION:  PT End of Session - 09/08/24 1031     Visit Number 15    Number of Visits 15    Date for Recertification  09/09/24    PT Start Time 1025    PT Stop Time 1106    PT Time Calculation (min) 41 min    Activity Tolerance Patient tolerated treatment well    Behavior During Therapy [facility] for tasks assessed/performed                      Past Medical History:  Diagnosis Date   Breast mass, right 10/17/2014   Present since patient was age 61. Continues to be stable. U/S on 10/17/2014   Past Surgical History:  Procedure Laterality Date   ACHILLES TENDON SURGERY     CESAREAN SECTION     TUBAL LIGATION Bilateral 2006   Patient Active Problem List   Diagnosis Date Noted   Elevated serum creatinine 05/13/2019   Pap smear for cervical cancer screening 05/13/2019   Health care maintenance 10/28/2018     REFERRING PROVIDER: Tomlinson, Sara Caylin, PA-C  REFERRING DIAG: [phone] (ICD-10-CM) - Annular tear of lumbar disc / Other intervertebral disc degeneration, lumbar region without mention of lumbar back pain or lower extremity pain  Rationale for Evaluation and Treatment: Rehabilitation  THERAPY DIAG:  Other low back pain  Muscle weakness (generalized)  Pain in right leg  ONSET DATE: 03/11/2024  SUBJECTIVE:                                                                                                                                                                                           SUBJECTIVE STATEMENT:  Pt states she feels it if she does too much or awakens it.  Pt reports she feels the pain if she sits too long or walks too fast.  Pt states she is not having as many muscle spasms.  The muscle spasms are hit and miss.  Pt reports an 80% improvement in pain and sx's overall.   Walking has improved, though she continues to have pain.  Pt reports a 90% improvement in standing tolerance.  Pt has not been having N/T.  Pt reports she has the tools she needs and is ready to be discharged.        PERTINENT HISTORY:  R Achilles tendon surgery  PAIN:  NPRS:  0/10 current, 3/10 worst Location:  R lumbar    PRECAUTIONS: None  WEIGHT BEARING RESTRICTIONS: No  FALLS:  Has patient fallen in last 6 months? No  LIVING ENVIRONMENT: Lives with:  domestic partner Lives in: 1 story apartment Stairs: no.  Small stoop to enter.   Has following equipment at home: None  OCCUPATION: Tailor  PLOF: Independent  PATIENT GOALS: reduce pain, to be able to perform cartwheels with niece, and to be able to walk my normal pace.     OBJECTIVE:  Note: Objective measures were completed at Evaluation unless otherwise noted.  DIAGNOSTIC FINDINGS:  MRI on 03/11/24: IMPRESSION: 1. L4-5 mild left asymmetric disc bulge with minimal narrowing of the left lateral recess. No spinal canal or neural foraminal stenosis. 2. L5-S1 small central disc protrusion with annular fissure. No spinal canal or neural foraminal stenosis.  PATIENT SURVEYS:  Modified Oswestry Disability Index:  11 = 22%  (Eval) 10 = 20%  (07/29/24)  8 =  16%   (09/08/24)   COGNITION: Overall cognitive status: Within functional limits for tasks assessed     SENSATION: 2+ sensation to LT t/o bilat LE dermatomes   LUMBAR ROM:   AROM eval  Flexion WFL with R sided lumbar pain  Extension 75% with central lumbar pain  Right lateral flexion WFL, pain when returning to standing  Left lateral flexion WFL  Right rotation [facility]  Left rotation WFL   (Blank rows = not tested)  LOWER EXTREMITY STRENGTH:     MMT  Right eval Left eval Right 12/5 Right  1/15  Hip flexion 5/5 5/5    Hip extension 4/5 5/5 4/5 with pain 5/5  Hip abduction 5/5 5/5    Hip adduction      Hip internal rotation      Hip external  rotation 5/5 5/5    Knee flexion 4+/5 5/5 4+/5 4+/5  Knee extension 4/5 5/5  5/5  Ankle dorsiflexion 5/5 5/5    Ankle plantarflexion WFL seated WFL seated    Ankle inversion      Ankle eversion       (Blank rows = not tested)  LOWER EXTREMITY ROM:    AROM Right eval Left eval  Hip flexion    Hip extension    Hip abduction [facility]  Hip adduction    Hip internal rotation    Hip external rotation    Knee flexion    Knee extension [facility]  Ankle dorsiflexion [facility]  Ankle plantarflexion [facility] WFL  Ankle inversion    Ankle eversion     (Blank rows = not tested)        TREATMENT:       09/08/24 Reviewed current function and pain/sx's, response to prior treatment, and HEP compliance.  PT assessed R LE strength.  PT educated pt concerning progress and POC.    Supine bridge with clams with TrA 2x10 with RTB S/L clams with RTB 2x10 with TrA Supine lumbar rotation 2x10 Supine piriformis stretch 2x30 sec bilat Supine HS stretch with strap 2x30 sec bilat PT extensively reviewed HEP.  Pt completed Modified Oswestry Disability Index.    09/05/24 LTR 5 x10ea Supine HSS with strap 30seconds x3ea Piriformis stretch 30sec x3bil S/l clam with RTB around thighs 3x10bil Bridge with clam RTB 2x10 Child's pose 30sec x3 Hooklying marching with TrA x20 Prone hip extension 2x10ea STS with cues for slow eccentric control 2x10 lowered plinth Nu-step L5 UE and LE HEP update and review    08/29/24 LTR 5 x10ea Supine HSS with strap Piriformis stretch  30sec x3bil S/l clam with RTB around thighs 2x15bil Bridge with clam RTB 2x10 Child's pose 15sec x5 Hooklying marching with TrA x20 Prone hip extension 2x10ea STS with cues for slow eccentric control x10 lowered plinth Nu-step L5 UE and LE     08/24/24 Nustep lvl 4 bilat UE/LE x 5 mins Supine marching with TrA 2x10 Supine alt UE/LE x10 Supine alt LE extension with TrA 2x10 while holding 2#  Supine bridge with  TrA with clams with RTB 2x10 Supine active neural flossing 2x10 PPU through limited range x 10 Prone prop up x 39 sec, 41 sec  Pt received supine manual HS stretch with passive AP's for neural flossing 2x30 sec on R LE Supine manual HS stretch L LE 2x30 sec  Grade II PA's to Lumbar SP's in prone     PATIENT EDUCATION:  Education details:  relevant anatomy, dx, goal progress, objective findings, POC, HEP, appropriate sx response, and rationale of interventions.   Person educated: Patient Education method: Explanation, Demonstration, Tactile cues, Verbal cues, and Handouts Education comprehension: verbalized understanding, returned demonstration, verbal cues required, tactile cues required, and needs further education  HOME EXERCISE PROGRAM:  Access Code: [email or web address] URL: [email or web address] Date: 06/22/2024 Prepared by: Josette Rough   ASSESSMENT:  CLINICAL IMPRESSION:  Pt has made great progress in PT.  She reports an 80% improvement in pain and sx's overall and a 90% improvement in standing tolerance.   Pt is not having as many muscle spasms and has not been having N/T.  She does have pain if she sits too long or walks too fast.  Though her walking has improved, she continues to have pain.  Pt has improved tolerance with exercises.  Pt demonstrates improved strength to 5/5 in R hip extension and knee extension.  Pt demonstrates improved self perceived disability with Modified Oswestry though not clinically significant.  PT extensively went through HEP and educated pt concerning exercises and appropriate sx response.  Pt is independent with HEP.  Pt met all of her STG's.  She made progress toward her LTG's and met 1/5 and partially met 1/5.  Pt reports she has the tools she needs and is ready to be discharged.    OBJECTIVE IMPAIRMENTS: decreased activity tolerance, decreased endurance, decreased mobility, difficulty walking, decreased ROM, decreased strength, increased  fascial restrictions, impaired flexibility, and pain.   ACTIVITY LIMITATIONS: sitting, standing, and locomotion level  PARTICIPATION LIMITATIONS: cleaning, driving, shopping, and community activity  PERSONAL FACTORS:    REHAB POTENTIAL: Good  CLINICAL DECISION MAKING: Stable/uncomplicated  EVALUATION COMPLEXITY: Low   GOALS:   SHORT TERM GOALS: Target date: 07/07/2024   Pt will be independent and compliant with HEP for improved pain, strength, and function.  Baseline: Goal status: GOAL MET  12/5  2.  Pt will report at least a 25% improvement in pain and sx's overall.  Baseline:  Goal status: GOAL MET  12/5  3.  Pt will demo improved core strength as evidenced by performance and progression of exercises without adverse effects for improved tolerance with and performance of daily activities and functional mobility.   Baseline:  Goal status:  GOAL MET  1/15   LONG TERM GOALS: Target date: 09/09/2024  Pt will report she is able to ambulate her normal community distance without increased pain.  Baseline:  Goal status: IMPROVED, BUT NOT MET  1/15  2.  Pt will report pain centralizing to her back in order for improved tolerance with ADLs/IADLs and functional mobility.  Baseline:  Goal status: IMPROVED, BUT NOT MET  1/15  3.  Pt will demo improved R LE strength to 5/5 MMT for improved functional mobility.   Baseline:  Goal status:  66% MET  1/15  4.  Pt will demo proper form with squat to lift in order to perform functional lifting activities without bending for reduced stress on lumbar and decreased isk of injury. Baseline:  Goal status: NOT ASSESSED  5.  Pt will report at least a 60% improvement in standing tolerance for improved performance of IADLs.  Baseline:  Goal status: GOAL MET  1/15    PLAN:  PT FREQUENCY: 2x/week  PT DURATION: 6 weeks  PLANNED INTERVENTIONS: [id number]- PT Re-evaluation, 97750- Physical Performance Testing, 97110-Therapeutic exercises,  97530- Therapeutic activity, 97112- Neuromuscular re-education, 97535- Self Care, [id number]- Manual therapy, [phone]- Gait training, [phone]- Aquatic Therapy, [phone]- Electrical stimulation (unattended), [phone]- Electrical stimulation (manual), [id number]- Ultrasound, 79439 (1-2 muscles), [id number] (3+ muscles)- Dry Needling, Patient/Family education, Taping, Joint mobilization, Spinal mobilization, Cryotherapy, and Moist heat.  PLAN FOR NEXT SESSION:  Pt to be discharged from skilled PT due to being pleased with current functional level and good progress.  Pt is agreeable with discharge.  She is independent with and will cont with HEP.  Pt is appreciative of skilled PT.   PHYSICAL THERAPY DISCHARGE SUMMARY  Visits from Start of Care: 15  Current functional level related to goals / functional outcomes: See above   Remaining deficits: See above   Education / Equipment: See above      Leigh Minerva III PT, DPT 09/09/24 11:44 AM        "

## 2024-09-14 ENCOUNTER — Encounter (HOSPITAL_BASED_OUTPATIENT_CLINIC_OR_DEPARTMENT_OTHER): Payer: Self-pay | Admitting: Physical Therapy

## 2024-09-22 ENCOUNTER — Encounter (HOSPITAL_BASED_OUTPATIENT_CLINIC_OR_DEPARTMENT_OTHER): Payer: Self-pay | Admitting: Physical Therapy
